# Patient Record
Sex: Female | Born: 1980 | Race: Black or African American | Hispanic: No | Marital: Single | Smoking: Never smoker
Health system: Southern US, Community
[De-identification: ages and names within clinical notes are randomized; demographics above are authoritative.]

## PROBLEM LIST (undated history)

## (undated) DIAGNOSIS — N760 Acute vaginitis: Secondary | ICD-10-CM

## (undated) DIAGNOSIS — I10 Essential (primary) hypertension: Secondary | ICD-10-CM

## (undated) DIAGNOSIS — Z309 Encounter for contraceptive management, unspecified: Secondary | ICD-10-CM

## (undated) DIAGNOSIS — B001 Herpesviral vesicular dermatitis: Secondary | ICD-10-CM

## (undated) DIAGNOSIS — N898 Other specified noninflammatory disorders of vagina: Principal | ICD-10-CM

## (undated) DIAGNOSIS — R87629 Unspecified abnormal cytological findings in specimens from vagina: Secondary | ICD-10-CM

## (undated) DIAGNOSIS — B9689 Other specified bacterial agents as the cause of diseases classified elsewhere: Secondary | ICD-10-CM

## (undated) HISTORY — DX: Other specified noninflammatory disorders of vagina: N89.8

## (undated) HISTORY — DX: Encounter for contraceptive management, unspecified: Z30.9

## (undated) HISTORY — DX: Acute vaginitis: N76.0

## (undated) HISTORY — DX: Herpesviral vesicular dermatitis: B00.1

## (undated) HISTORY — DX: Other specified bacterial agents as the cause of diseases classified elsewhere: B96.89

## (undated) HISTORY — DX: Essential (primary) hypertension: I10

## (undated) HISTORY — DX: Unspecified abnormal cytological findings in specimens from vagina: R87.629

---

## 2015-06-22 ENCOUNTER — Ambulatory Visit (INDEPENDENT_AMBULATORY_CARE_PROVIDER_SITE_OTHER): Payer: Managed Care, Other (non HMO) | Admitting: Adult Health

## 2015-06-22 ENCOUNTER — Encounter: Payer: Self-pay | Admitting: Adult Health

## 2015-06-22 VITALS — BP 128/78 | HR 80 | Ht 59.0 in | Wt 120.0 lb

## 2015-06-22 DIAGNOSIS — Z30013 Encounter for initial prescription of injectable contraceptive: Secondary | ICD-10-CM

## 2015-06-22 DIAGNOSIS — N9489 Other specified conditions associated with female genital organs and menstrual cycle: Secondary | ICD-10-CM

## 2015-06-22 DIAGNOSIS — N760 Acute vaginitis: Secondary | ICD-10-CM | POA: Diagnosis not present

## 2015-06-22 DIAGNOSIS — Z309 Encounter for contraceptive management, unspecified: Secondary | ICD-10-CM | POA: Insufficient documentation

## 2015-06-22 DIAGNOSIS — A499 Bacterial infection, unspecified: Secondary | ICD-10-CM

## 2015-06-22 DIAGNOSIS — B9689 Other specified bacterial agents as the cause of diseases classified elsewhere: Secondary | ICD-10-CM

## 2015-06-22 DIAGNOSIS — N898 Other specified noninflammatory disorders of vagina: Secondary | ICD-10-CM

## 2015-06-22 HISTORY — DX: Encounter for contraceptive management, unspecified: Z30.9

## 2015-06-22 HISTORY — DX: Other specified bacterial agents as the cause of diseases classified elsewhere: B96.89

## 2015-06-22 HISTORY — DX: Other specified noninflammatory disorders of vagina: N89.8

## 2015-06-22 LAB — POCT WET PREP (WET MOUNT)
Clue Cells Wet Prep Whiff POC: POSITIVE
WBC, Wet Prep HPF POC: POSITIVE

## 2015-06-22 MED ORDER — METRONIDAZOLE 500 MG PO TABS: 500.0000 mg | ORAL_TABLET | Freq: Two times a day (BID) | ORAL | Status: DC

## 2015-06-22 MED ORDER — MEDROXYPROGESTERONE ACETATE 150 MG/ML IM SUSP: 150.0000 mg | INTRAMUSCULAR | Status: DC

## 2015-06-22 NOTE — Progress Notes (Signed)
Subjective:     Patient ID: Heather PerchesLatisha Dodson, female   DOB: [birth year], 35 y.o.   MRN: [id number]  HPI [name] is a 35 year old black female, new to this practice in complaining of vaginal odor, smells fishy, no discharge and wants to get back on Depo.   Review of Systems Patient denies any headaches, hearing loss, fatigue, blurred vision, shortness of breath, chest pain, abdominal pain, problems with bowel movements, urination, or intercourse(not having often). No joint pain or mood swings.See HPI for positives. Reviewed past medical,surgical, social and family history. Reviewed medications and allergies.     Objective:   Physical Exam   BP 128/78 mmHg  Pulse 80  Ht 4\' 11"  (1.499 m)  Wt 120 lb (54.432 kg)  BMI 24.22 kg/m2  LMP 06/13/2015 Skin warm and dry. Neck: mid line trachea, normal thyroid, good ROM, no lymphadenopathy noted. Lungs: clear to ausculation bilaterally. Cardiovascular: regular rate and rhythm.Pelvic: external genitalia is normal in appearance no lesions, vagina: white discharge with odor,urethra has no lesions or masses noted, cervix:smooth and bulbous, uterus: normal size, shape and contour, non tender, no masses felt, adnexa: no masses or tenderness noted. Bladder is non tender and no masses felt. Wet prep: + for clue cells and +WBCs.  She wants to get on Depo, has used in past, had some hair loss then, but was years ago.She is aware it could happen again, she is also aware could have irregular bleeding or no period, or weight gain and still wants to try it. Will get her back in for pap and physical next week. Face time 20 minutes, with 50% counseling.    Assessment:    Vaginal odor BV Contraceptive management     Plan:    Rx flagyl 500 mg 1 bid x 7 days, no alcohol, review handout on BV   Return in 1 week for pap and physical and GC/CHL then  Rx Depo provera 150 mg, disp.# 1 vail for IM injection every 3 months in office, with 4 refills, call with period to get first  injection

## 2015-06-22 NOTE — Patient Instructions (Signed)
Bacterial Vaginosis Bacterial vaginosis is a vaginal infection that occurs when the normal balance of bacteria in the vagina is disrupted. It results from an overgrowth of certain bacteria. This is the most common vaginal infection in women of childbearing age. Treatment is important to prevent complications, especially in pregnant women, as it can cause a premature delivery. CAUSES  Bacterial vaginosis is caused by an increase in harmful bacteria that are normally present in smaller amounts in the vagina. Several different kinds of bacteria can cause bacterial vaginosis. However, the reason that the condition develops is not fully understood. RISK FACTORS Certain activities or behaviors can put you at an increased risk of developing bacterial vaginosis, including:  Having a new sex partner or multiple sex partners.  Douching.  Using an intrauterine device (IUD) for contraception. Women do not get bacterial vaginosis from toilet seats, bedding, swimming pools, or contact with objects around them. SIGNS AND SYMPTOMS  Some women with bacterial vaginosis have no signs or symptoms. Common symptoms include:  Grey vaginal discharge.  A fishlike odor with discharge, especially after sexual intercourse.  Itching or burning of the vagina and vulva.  Burning or pain with urination. DIAGNOSIS  Your health care provider will take a medical history and examine the vagina for signs of bacterial vaginosis. A sample of vaginal fluid may be taken. Your health care provider will look at this sample under a microscope to check for bacteria and abnormal cells. A vaginal pH test may also be done.  TREATMENT  Bacterial vaginosis may be treated with antibiotic medicines. These may be given in the form of a pill or a vaginal cream. A second round of antibiotics may be prescribed if the condition comes back after treatment. Because bacterial vaginosis increases your risk for sexually transmitted diseases, getting  treated can help reduce your risk for chlamydia, gonorrhea, HIV, and herpes. HOME CARE INSTRUCTIONS   Only take over-the-counter or prescription medicines as directed by your health care provider.  If antibiotic medicine was prescribed, take it as directed. Make sure you finish it even if you start to feel better.  Tell all sexual partners that you have a vaginal infection. They should see their health care provider and be treated if they have problems, such as a mild rash or itching.  During treatment, it is important that you follow these instructions:  Avoid sexual activity or use condoms correctly.  Do not douche.  Avoid alcohol as directed by your health care provider.  Avoid breastfeeding as directed by your health care provider. SEEK MEDICAL CARE IF:   Your symptoms are not improving after 3 days of treatment.  You have increased discharge or pain.  You have a fever. MAKE SURE YOU:   Understand these instructions.  Will watch your condition.  Will get help right away if you are not doing well or get worse. FOR MORE INFORMATION  Centers for Disease Control and Prevention, Division of STD Prevention: SolutionApps.co.zawww.cdc.gov/std American Sexual Health Association (ASHA): www.ashastd.org    This information is not intended to replace advice given to you by your health care provider. Make sure you discuss any questions you have with your health care provider.   Document Released: 04/02/2005 Document Revised: 04/23/2014 Document Reviewed: 11/12/2012 Elsevier Interactive Patient Education 2016 [employer] No alcohol or sex Return in 1 week for pap and physical

## 2015-07-04 ENCOUNTER — Encounter: Payer: Self-pay | Admitting: Adult Health

## 2015-07-04 ENCOUNTER — Other Ambulatory Visit (HOSPITAL_COMMUNITY)
Admission: RE | Admit: 2015-07-04 | Discharge: 2015-07-04 | Disposition: A | Discharge status: Home / Self Care | Payer: Managed Care, Other (non HMO) | Source: Ambulatory Visit | Attending: Adult Health | Admitting: Adult Health

## 2015-07-04 ENCOUNTER — Ambulatory Visit (INDEPENDENT_AMBULATORY_CARE_PROVIDER_SITE_OTHER): Payer: Managed Care, Other (non HMO) | Admitting: Adult Health

## 2015-07-04 VITALS — BP 130/80 | HR 76 | Ht 61.0 in | Wt 118.5 lb

## 2015-07-04 DIAGNOSIS — Z01419 Encounter for gynecological examination (general) (routine) without abnormal findings: Secondary | ICD-10-CM | POA: Diagnosis not present

## 2015-07-04 DIAGNOSIS — Z1151 Encounter for screening for human papillomavirus (HPV): Secondary | ICD-10-CM | POA: Insufficient documentation

## 2015-07-04 DIAGNOSIS — B001 Herpesviral vesicular dermatitis: Secondary | ICD-10-CM

## 2015-07-04 DIAGNOSIS — Z113 Encounter for screening for infections with a predominantly sexual mode of transmission: Secondary | ICD-10-CM | POA: Diagnosis present

## 2015-07-04 DIAGNOSIS — Z01411 Encounter for gynecological examination (general) (routine) with abnormal findings: Secondary | ICD-10-CM | POA: Diagnosis present

## 2015-07-04 HISTORY — DX: Herpesviral vesicular dermatitis: B00.1

## 2015-07-04 MED ORDER — VALACYCLOVIR HCL 1 G PO TABS: ORAL_TABLET | ORAL | Status: DC

## 2015-07-04 NOTE — Progress Notes (Signed)
Patient ID: Heather PerchesLatisha Dodson, female   DOB: 1981/03/01, 35 y.o.   MRN: [id number] History of Present Illness: Heather Dodson is a 35 year old black female in for a well woman gyn exam and pap,she was seen 3/8 and treated for BV and was given Rx for depo, to start with next period.She complains of sore bump bottom lip, started itching yesterday.She says no more discharge or odor.  Current Medications, Allergies, Past Medical History, Past Surgical History, Family History and Social History were reviewed in Heather Dodson.     Review of Systems: Patient denies any headaches, hearing loss, fatigue, blurred vision, shortness of breath, chest pain, abdominal pain, problems with bowel movements, urination, or intercourse. No joint pain or mood swings.See HPI for positives.    Physical Exam:BP 130/80 mmHg  Pulse 76  Ht 5\' 1"  (1.549 m)  Wt 118 lb 8 oz (53.751 kg)  BMI 22.40 kg/m2  LMP 06/14/2015 General:  Well developed, well nourished, no acute distress Skin:  Warm and dry, has cold sore bottom lip Neck:  Midline trachea, normal thyroid, good ROM, no lymphadenopathy Lungs; Clear to auscultation bilaterally Breast:  No dominant palpable mass, retraction, or nipple discharge Cardiovascular: Regular rate and rhythm Abdomen:  Soft, non tender, no hepatosplenomegaly Pelvic:  External genitalia is normal in appearance, no lesions.  The vagina is normal in appearance. Urethra has no lesions or masses. The cervix is smooth. Pap with GC/CHL and HPV performed. Uterus is felt to be normal size, shape, and contour.  No adnexal masses or tenderness noted.Bladder is non tender, no masses felt. Extremities/musculoskeletal:  No swelling or varicosities noted, no clubbing or cyanosis Psych:  No mood changes, alert and cooperative,seems happy Discussed causes of cold sore.  Impression: Well woman gyn exam and pap Cold sore     Plan: Rx valtrex 1 gm #10 take 2 po now and 2 in am with 1  refill Discard lip gloss and tooth brush Physical in 1 year, pap in 3 if normal Call with period for depo  Review handout on cold sores

## 2015-07-04 NOTE — Patient Instructions (Signed)
Oral Ulcers Oral ulcers are painful, shallow sores around the lining of the mouth. They can affect the gums, the inside of the lips, and the cheeks. (Sores on the outside of the lips and on the face are different.) They typically first occur in school-aged children and teenagers. Oral ulcers may also be called canker sores or cold sores. CAUSES  Canker sores and cold sores can be caused by many factors including:  Infection.  Injury.  Sun exposure.  Medications.  Emotional stress.  Food allergies.  Vitamin deficiencies.  Toothpastes containing sodium lauryl sulfate. The herpes virus can be the cause of mouth ulcers. The first infection can be severe and cause 10 or more ulcers on the gums, tongue, and lips with fever and difficulty in swallowing. This infection usually occurs between the ages of 75 and 3 years.  SYMPTOMS  The typical sore is about  inch (6 mm) in size and is an oval or round ulcer with red borders. DIAGNOSIS  Your caregiver can diagnose simple oral ulcers by examination. Additional testing is usually not required.  TREATMENT  Treatment is aimed at pain relief. Generally, oral ulcers resolve by themselves within 1 to 2 weeks without medication and are not contagious unless caused by herpes (and other viruses). Antibiotics are not effective with mouth sores. Avoid direct contact with others until the ulcer is completely healed. See your caregiver for follow-up care as recommended. Also:  Offer a soft diet.  Encourage plenty of fluids to prevent dehydration. Popsicles and milk shakes can be helpful.  Avoid acidic and salty foods and drinks such as orange juice.  Infants and young children will often refuse to drink because of pain. Using a teaspoon, cup, or syringe to give small amounts of fluids frequently can help prevent dehydration.  Cold compresses on the face may help reduce pain.  Pain medication can help control soreness.  A solution of diphenhydramine  mixed with a liquid antacid can be useful to decrease the soreness of ulcers. Consult a caregiver for the dosing.  Liquids or ointments with a numbing ingredient may be helpful when used as recommended.  Older children and teenagers can rinse their mouth with a salt-water mixture (1/2 teaspoon of salt in 8 ounces of water) four times a day. This treatment is uncomfortable but may reduce the time the ulcers are present.  There are many over-the-counter throat lozenges and medications available for oral ulcers. Their effectiveness has not been studied.  Consult your medical caregiver prior to using homeopathic treatments for oral ulcers. SEEK MEDICAL CARE IF:   You think your child needs to be seen.  The pain worsens and you cannot control it.  There are 4 or more ulcers.  The lips and gums begin to bleed and crust.  A single mouth ulcer is near a tooth that is causing a toothache or pain.  Your child has a fever, swollen face, or swollen glands.  The ulcers began after starting a medication.  Mouth ulcers keep reoccurring or last more than 2 weeks.  You think your child is not taking adequate fluids. SEEK IMMEDIATE MEDICAL CARE IF:   Your child has a high fever.  Your child is unable to swallow or becomes dehydrated.  Your child looks or acts very ill.  An ulcer caused by a chemical your child accidentally put in their mouth.   This information is not intended to replace advice given to you by your health care provider. Make sure you discuss any  questions you have with your health care provider.   Document Released: 05/10/2004 Document Revised: 04/23/2014 Document Reviewed: 08/18/2014 Elsevier Interactive Patient Education 2016 [employer] Take 2 valtrex now and 2 in am Physical in 1 year Call with period to get depo

## 2015-07-05 LAB — CYTOLOGY - PAP

## 2015-07-19 ENCOUNTER — Ambulatory Visit (INDEPENDENT_AMBULATORY_CARE_PROVIDER_SITE_OTHER): Payer: Managed Care, Other (non HMO) | Admitting: *Deleted

## 2015-07-19 ENCOUNTER — Encounter: Payer: Self-pay | Admitting: *Deleted

## 2015-07-19 DIAGNOSIS — Z3202 Encounter for pregnancy test, result negative: Secondary | ICD-10-CM | POA: Diagnosis not present

## 2015-07-19 DIAGNOSIS — Z3042 Encounter for surveillance of injectable contraceptive: Secondary | ICD-10-CM | POA: Diagnosis not present

## 2015-07-19 LAB — POCT URINE PREGNANCY: Preg Test, Ur: NEGATIVE

## 2015-07-19 MED ORDER — MEDROXYPROGESTERONE ACETATE 150 MG/ML IM SUSP
150.0000 mg | Freq: Once | INTRAMUSCULAR | Status: AC
  Administered 2015-07-19: 150 mg via INTRAMUSCULAR

## 2015-07-19 NOTE — Progress Notes (Signed)
Pt here for Depo. Pt tolerated shot well. Return in 12 weeks for next shot. JSY 

## 2015-09-28 ENCOUNTER — Telehealth: Payer: Self-pay | Admitting: *Deleted

## 2015-09-28 MED ORDER — MEGESTROL ACETATE 40 MG PO TABS: ORAL_TABLET | ORAL | Status: DC

## 2015-09-28 NOTE — Telephone Encounter (Signed)
Pt complains spotting with depo, next shot due 6/23 ,will rx megace to see if helps, told her usually when gets the next shot it should stop

## 2015-10-10 ENCOUNTER — Ambulatory Visit (INDEPENDENT_AMBULATORY_CARE_PROVIDER_SITE_OTHER): Payer: Managed Care, Other (non HMO) | Admitting: *Deleted

## 2015-10-10 ENCOUNTER — Encounter: Payer: Self-pay | Admitting: *Deleted

## 2015-10-10 DIAGNOSIS — Z3042 Encounter for surveillance of injectable contraceptive: Secondary | ICD-10-CM | POA: Diagnosis not present

## 2015-10-10 DIAGNOSIS — Z3202 Encounter for pregnancy test, result negative: Secondary | ICD-10-CM | POA: Diagnosis not present

## 2015-10-10 LAB — POCT URINE PREGNANCY: Preg Test, Ur: NEGATIVE

## 2015-10-10 MED ORDER — MEDROXYPROGESTERONE ACETATE 150 MG/ML IM SUSP
150.0000 mg | Freq: Once | INTRAMUSCULAR | Status: AC
Start: 2015-10-10 — End: 2015-10-10
  Administered 2015-10-10: 150 mg via INTRAMUSCULAR

## 2015-10-10 NOTE — Progress Notes (Signed)
Pt here for Depo Provera injection, reports no problems or concerns at this time.  Injection given in Lt Deltoid and pt tolerated well.

## 2015-10-11 ENCOUNTER — Ambulatory Visit: Payer: Managed Care, Other (non HMO)

## 2016-01-02 ENCOUNTER — Ambulatory Visit (INDEPENDENT_AMBULATORY_CARE_PROVIDER_SITE_OTHER): Payer: Managed Care, Other (non HMO) | Admitting: *Deleted

## 2016-01-02 DIAGNOSIS — Z3202 Encounter for pregnancy test, result negative: Secondary | ICD-10-CM

## 2016-01-02 DIAGNOSIS — Z3042 Encounter for surveillance of injectable contraceptive: Secondary | ICD-10-CM

## 2016-01-02 LAB — POCT URINE PREGNANCY: Preg Test, Ur: NEGATIVE

## 2016-01-02 MED ORDER — MEDROXYPROGESTERONE ACETATE 150 MG/ML IM SUSP
150.0000 mg | Freq: Once | INTRAMUSCULAR | Status: AC
  Administered 2016-01-02: 150 mg via INTRAMUSCULAR

## 2016-01-02 NOTE — Progress Notes (Signed)
Depo Provera 150 mg IM given in right deltoid with no complications, negative pregnancy test. Pt to return in 12 weeks for next injection.  

## 2016-03-26 ENCOUNTER — Encounter: Payer: Self-pay | Admitting: *Deleted

## 2016-03-26 ENCOUNTER — Ambulatory Visit (INDEPENDENT_AMBULATORY_CARE_PROVIDER_SITE_OTHER): Payer: Managed Care, Other (non HMO) | Admitting: *Deleted

## 2016-03-26 ENCOUNTER — Encounter (INDEPENDENT_AMBULATORY_CARE_PROVIDER_SITE_OTHER): Payer: Self-pay

## 2016-03-26 DIAGNOSIS — Z3042 Encounter for surveillance of injectable contraceptive: Secondary | ICD-10-CM | POA: Diagnosis not present

## 2016-03-26 DIAGNOSIS — Z308 Encounter for other contraceptive management: Secondary | ICD-10-CM

## 2016-03-26 DIAGNOSIS — Z3202 Encounter for pregnancy test, result negative: Secondary | ICD-10-CM

## 2016-03-26 LAB — POCT URINE PREGNANCY: PREG TEST UR: NEGATIVE

## 2016-03-26 MED ORDER — MEDROXYPROGESTERONE ACETATE 150 MG/ML IM SUSP
150.0000 mg | Freq: Once | INTRAMUSCULAR | Status: AC
  Administered 2016-03-26: 150 mg via INTRAMUSCULAR

## 2016-03-26 NOTE — Progress Notes (Signed)
Pt here for Depo. Pt tolerated shot well. Return in 12 weeks for next shot. JSY 

## 2016-05-24 ENCOUNTER — Ambulatory Visit: Payer: Self-pay | Admitting: Physician Assistant

## 2016-05-24 ENCOUNTER — Encounter: Payer: Self-pay | Admitting: Physician Assistant

## 2016-05-24 VITALS — BP 150/88 | HR 108 | Temp 99.2°F

## 2016-05-24 DIAGNOSIS — B349 Viral infection, unspecified: Secondary | ICD-10-CM

## 2016-05-24 LAB — POCT INFLUENZA A/B

## 2016-05-24 MED ORDER — OSELTAMIVIR PHOSPHATE 75 MG PO CAPS: 75.0000 mg | ORAL_CAPSULE | Freq: Two times a day (BID) | ORAL | 0 refills | Status: DC

## 2016-05-24 MED ORDER — PSEUDOEPH-BROMPHEN-DM 30-2-10 MG/5ML PO SYRP: 5.0000 mL | ORAL_SOLUTION | Freq: Four times a day (QID) | ORAL | 0 refills | Status: DC | PRN

## 2016-05-24 MED ORDER — IBUPROFEN 800 MG PO TABS: 800.0000 mg | ORAL_TABLET | Freq: Three times a day (TID) | ORAL | 0 refills | Status: DC | PRN

## 2016-05-24 NOTE — Progress Notes (Signed)
   Subjective:URI    Patient ID: Heather PerchesLatisha Dodson, female    DOB: Mar 08, 1981, 36 y.o.   MRN: [id number]  HPI Patient c/o fever, sore throat, nasal congestion, and cough for 2 days. Recently exposed to Flu. Patient has not taken Flu shot for this season. Denies N/V/D. No palliative measures for compliant.   Review of Systems    Negative except for compliant. Objective:   Physical Exam Febrile. Edematous nasal turbinates with clear rhinorrhea. Post nasal drainage. Neck supple w/o adenopathy. Lungs CTA and Heart RRR.Rapid Flu test was negative.       Assessment & Plan:Flu Like Illness.  Tamiflu, Bromfed DM, and Ibuprofen. Follow up with Family Doctor.

## 2016-05-24 NOTE — Addendum Note (Signed)
Addended by: Catha BrowEACON, Sharran Caratachea T on: 05/24/2016 04:14 PM   Modules accepted: Orders

## 2016-06-18 ENCOUNTER — Ambulatory Visit: Payer: Managed Care, Other (non HMO)

## 2016-06-22 ENCOUNTER — Ambulatory Visit: Payer: Managed Care, Other (non HMO)

## 2016-06-22 ENCOUNTER — Other Ambulatory Visit: Payer: Self-pay | Admitting: Adult Health

## 2016-06-25 ENCOUNTER — Encounter: Payer: Self-pay | Admitting: *Deleted

## 2016-06-25 ENCOUNTER — Ambulatory Visit: Payer: Managed Care, Other (non HMO)

## 2016-10-11 ENCOUNTER — Other Ambulatory Visit: Payer: Managed Care, Other (non HMO) | Admitting: Women's Health

## 2016-10-11 ENCOUNTER — Encounter: Payer: Self-pay | Admitting: Advanced Practice Midwife

## 2016-10-11 ENCOUNTER — Ambulatory Visit (INDEPENDENT_AMBULATORY_CARE_PROVIDER_SITE_OTHER): Payer: Managed Care, Other (non HMO) | Admitting: Advanced Practice Midwife

## 2016-10-11 VITALS — BP 110/80 | HR 76 | Ht 59.0 in | Wt 128.0 lb

## 2016-10-11 DIAGNOSIS — Z13220 Encounter for screening for lipoid disorders: Secondary | ICD-10-CM | POA: Diagnosis not present

## 2016-10-11 DIAGNOSIS — Z01419 Encounter for gynecological examination (general) (routine) without abnormal findings: Secondary | ICD-10-CM

## 2016-10-11 DIAGNOSIS — Z131 Encounter for screening for diabetes mellitus: Secondary | ICD-10-CM

## 2016-10-11 NOTE — Progress Notes (Signed)
Heather PerchesLatisha Dodson 36 y.o.  Vitals:   10/11/16 1626  BP: 110/80  Pulse: 76     Filed Weights   10/11/16 1626  Weight: 128 lb (58.1 kg)    Past Medical History: Past Medical History:  Diagnosis Date  . BV (bacterial vaginosis) 06/22/2015  . 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 Contraceptive management 06/22/2015  . Vaginal odor 06/22/2015  . Vaginal Pap smear, abnormal     Past Surgical History: History reviewed. No pertinent surgical history.  Family History: Family History  Problem Relation Age of Onset  . Hypertension Mother   . Other Mother        had kidney removed due to kidney failure  . Cancer Mother        breast  . Hypertension Father   . Hyperlipidemia Father   . Diabetes Father   . Cancer Father        prostate  . Hypertension Sister   . Cancer Paternal Grandmother   . Cancer Paternal Grandfather     Social History: Social History  Substance Use Topics  . Smoking status: Never Smoker  . Smokeless tobacco: Never Used  . Alcohol use Yes     Comment: occ    Allergies: No Known Allergies   No current outpatient prescriptions on file.  History of Present Illness: Here for physical, needs form filled out for work.  Has a place for LDL/HDL/and blood sugar, but had those done at work w/ a fingerstick, so labs ordered but she is going to check with work and see if she really needs to have them rechecked.  Had last depo shot in January, but still hasn;t started having periods yet.  Aware that it can take 12-18  Months in some cases for depo to wear off.  Not interested in a pg challenge.  Doesn't want any birth control now, boyfriend lives in another state.  Options discussed. Last pap 09/2015, normal.     Review of Systems   Patient denies any headaches, blurred vision, shortness of breath, chest pain, abdominal pain, problems with bowel movements, urination, or intercourse.   Physical Exam: General:  Well developed, well nourished, no acute distress Skin:  Warm and  dry Neck:  Midline trachea, normal thyroid Lungs; Clear to auscultation bilaterally Breast:  No dominant palpable mass, retraction, or nipple discharge Cardiovascular: Regular rate and rhythm Abdomen:  Soft, non tender, no hepatosplenomegaly Pelvic:  External genitalia is normal in appearance.  The vagina is normal in appearance.  The cervix is bulbous.  Uterus is felt to be normal size, shape, and contour.  No adnexal masses or tenderness noted.  Extremities:  No swelling or varicosities noted Psych:  No mood chang                    Impression: normal GYN exam     Plan: pap in 2020 [facility]

## 2016-12-20 ENCOUNTER — Ambulatory Visit: Payer: Self-pay | Admitting: Physician Assistant

## 2016-12-20 VITALS — BP 120/60 | HR 86 | Temp 98.6°F | Resp 16

## 2016-12-20 DIAGNOSIS — R3915 Urgency of urination: Secondary | ICD-10-CM

## 2016-12-20 DIAGNOSIS — N390 Urinary tract infection, site not specified: Secondary | ICD-10-CM

## 2016-12-20 DIAGNOSIS — R319 Hematuria, unspecified: Secondary | ICD-10-CM

## 2016-12-20 LAB — POCT URINALYSIS DIPSTICK
Bilirubin, UA: NEGATIVE
Glucose, UA: NEGATIVE
KETONES UA: NEGATIVE
NITRITE UA: POSITIVE
PH UA: 5.5 (ref 5.0–8.0)
PROTEIN UA: NEGATIVE
Spec Grav, UA: 1.01 (ref 1.010–1.025)
UROBILINOGEN UA: 0.2 U/dL

## 2016-12-20 MED ORDER — CIPROFLOXACIN HCL 250 MG PO TABS: 250.0000 mg | ORAL_TABLET | Freq: Two times a day (BID) | ORAL | 0 refills | Status: DC

## 2016-12-20 NOTE — Progress Notes (Signed)
S:  C/o uti sx for 2 days,  urgency, frequency, denies vaginal discharge, abdominal pain or flank pain, no painful urination, no fever/chills:  Remainder ros neg  O:  Vitals wnl, nad, no cva tenderness, back nontender, lungs c t a,cv rrr,  n/v intact  A: uti  P: cipro  bid x 7d, increase water intake, add cranberry juice, return if not improving in 2 -3 days, return earlier if worsening, discussed pyelonephritis sx, urine culture

## 2016-12-22 LAB — URINE CULTURE

## 2017-05-09 ENCOUNTER — Ambulatory Visit: Payer: Self-pay | Admitting: Medical

## 2017-05-09 ENCOUNTER — Ambulatory Visit: Payer: Self-pay

## 2017-05-09 VITALS — BP 150/89 | HR 86 | Temp 98.4°F | Resp 16

## 2017-05-09 DIAGNOSIS — J069 Acute upper respiratory infection, unspecified: Secondary | ICD-10-CM

## 2017-05-09 DIAGNOSIS — J0100 Acute maxillary sinusitis, unspecified: Secondary | ICD-10-CM

## 2017-05-09 MED ORDER — AMOXICILLIN-POT CLAVULANATE 875-125 MG PO TABS: 1.0000 | ORAL_TABLET | Freq: Two times a day (BID) | ORAL | 0 refills | Status: DC

## 2017-05-09 NOTE — Progress Notes (Signed)
   Subjective:    Patient ID: Gerhard PerchesLatisha Pucci, female    DOB: [birth year], 37 y.o.   MRN: [id number]  HPI  4 days history of sinus congestion and cough yellow productive. Denies fevers but has had chills. Initially started as sore throat but that has been improving.  Took Nyquil last night and slept well.   Review of Systems  Constitutional: Positive for chills and fatigue. Negative for fever.  HENT: Positive for congestion, postnasal drip, rhinorrhea and sore throat. Negative for ear pain.   Respiratory: Positive for cough. Negative for shortness of breath.   Cardiovascular: Negative for chest pain.  Gastrointestinal: Negative for abdominal pain.  Musculoskeletal: Positive for myalgias.  Skin: Negative for rash.  Neurological: Negative for dizziness, syncope and light-headedness.   Last period 05/01/2017 Denies pregnancy    Objective:   Physical Exam  Constitutional: She is oriented to person, place, and time. She appears well-developed and well-nourished.  HENT:  Head: Normocephalic and atraumatic.  Right Ear: Hearing, external ear and ear canal normal.  Left Ear: Hearing, external ear and ear canal normal.  Nose: Mucosal edema and rhinorrhea present.  Mouth/Throat: Uvula is midline. Posterior oropharyngeal erythema present.  Eyes: Conjunctivae and EOM are normal.  Neck: Normal range of motion. Neck supple.  Cardiovascular: Normal rate, regular rhythm and normal heart sounds.  Pulmonary/Chest: Effort normal and breath sounds normal.  Lymphadenopathy:    She has no cervical adenopathy.  Neurological: She is alert and oriented to person, place, and time.  Skin: Skin is warm and dry.  Psychiatric: She has a normal mood and affect. Her behavior is normal. Judgment and thought content normal.      Cerumen obscuring TM bilaterally    Assessment & Plan:  Sinusitis / Upper Respiratory Infection. Meds ordered this encounter  Medications  . amoxicillin-clavulanate (AUGMENTIN)  875-125 MG tablet    Sig: Take 1 tablet by mouth 2 (two) times daily.    Dispense:  20 tablet    Refill:  0  Return in  3-5 days if not improving. Patient verbalizes understanding and has no questions at discharge.

## 2017-05-09 NOTE — Patient Instructions (Signed)
Upper Respiratory Infection, Adult Most upper respiratory infections (URIs) are caused by a virus. A URI affects the nose, throat, and upper air passages. The most common type of URI is often called "the common cold." Follow these instructions at home:  Take medicines only as told by your doctor.  Gargle warm saltwater or take cough drops to comfort your throat as told by your doctor.  Use a warm mist humidifier or inhale steam from a shower to increase air moisture. This may make it easier to breathe.  Drink enough fluid to keep your pee (urine) clear or pale yellow.  Eat soups and other clear broths.  Have a healthy diet.  Rest as needed.  Go back to work when your fever is gone or your doctor says it is okay. ? You may need to stay home longer to avoid giving your URI to others. ? You can also wear a face mask and wash your hands often to prevent spread of the virus.  Use your inhaler more if you have asthma.  Do not use any tobacco products, including cigarettes, chewing tobacco, or electronic cigarettes. If you need help quitting, ask your doctor. Contact a doctor if:  You are getting worse, not better.  Your symptoms are not helped by medicine.  You have chills.  You are getting more short of breath.  You have brown or red mucus.  You have yellow or brown discharge from your nose.  You have pain in your face, especially when you bend forward.  You have a fever.  You have puffy (swollen) neck glands.  You have pain while swallowing.  You have white areas in the back of your throat. Get help right away if:  You have very bad or constant: ? Headache. ? Ear pain. ? Pain in your forehead, behind your eyes, and over your cheekbones (sinus pain). ? Chest pain.  You have long-lasting (chronic) lung disease and any of the following: ? Wheezing. ? Long-lasting cough. ? Coughing up blood. ? A change in your usual mucus.  You have a stiff neck.  You have  changes in your: ? Vision. ? Hearing. ? Thinking. ? Mood. This information is not intended to replace advice given to you by your health care provider. Make sure you discuss any questions you have with your health care provider. Document Released: 09/19/2007 Document Revised: 12/04/2015 Document Reviewed: 07/08/2013 Elsevier Interactive Patient Education  2018 [employer] Inc. Sinusitis, Adult Sinusitis is soreness and inflammation of your sinuses. Sinuses are hollow spaces in the bones around your face. They are located:  Around your eyes.  In the middle of your forehead.  Behind your nose.  In your cheekbones.  Your sinuses and nasal passages are lined with a stringy fluid (mucus). Mucus normally drains out of your sinuses. When your nasal tissues get inflamed or swollen, the mucus can get trapped or blocked so air cannot flow through your sinuses. This lets bacteria, viruses, and funguses grow, and that leads to infection. Follow these instructions at home: Medicines  Take, use, or apply over-the-counter and prescription medicines only as told by your doctor. These may include nasal sprays.  If you were prescribed an antibiotic medicine, take it as told by your doctor. Do not stop taking the antibiotic even if you start to feel better. Hydrate and Humidify  Drink enough water to keep your pee (urine) clear or pale yellow.  Use a cool mist humidifier to keep the humidity level in your home above   50%.  Breathe in steam for 10-15 minutes, 3-4 times a day or as told by your doctor. You can do this in the bathroom while a hot shower is running.  Try not to spend time in cool or dry air. Rest  Rest as much as possible.  Sleep with your head raised (elevated).  Make sure to get enough sleep each night. General instructions  Put a warm, moist washcloth on your face 3-4 times a day or as told by your doctor. This will help with discomfort.  Wash your hands often with soap and  water. If there is no soap and water, use hand sanitizer.  Do not smoke. Avoid being around people who are smoking (secondhand smoke).  Keep all follow-up visits as told by your doctor. This is important. Contact a doctor if:  You have a fever.  Your symptoms get worse.  Your symptoms do not get better within 10 days. Get help right away if:  You have a very bad headache.  You cannot stop throwing up (vomiting).  You have pain or swelling around your face or eyes.  You have trouble seeing.  You feel confused.  Your neck is stiff.  You have trouble breathing. This information is not intended to replace advice given to you by your health care provider. Make sure you discuss any questions you have with your health care provider. Document Released: 09/19/2007 Document Revised: 11/27/2015 Document Reviewed: 01/26/2015 Elsevier Interactive Patient Education  2018 [employer]vier Inc.  

## 2017-08-15 ENCOUNTER — Ambulatory Visit: Payer: Self-pay | Admitting: Family Medicine

## 2017-08-15 VITALS — BP 147/85 | HR 86 | Temp 98.5°F | Resp 20

## 2017-08-15 DIAGNOSIS — R059 Cough, unspecified: Secondary | ICD-10-CM

## 2017-08-15 DIAGNOSIS — J0100 Acute maxillary sinusitis, unspecified: Secondary | ICD-10-CM

## 2017-08-15 DIAGNOSIS — R05 Cough: Secondary | ICD-10-CM

## 2017-08-15 MED ORDER — BENZONATATE 200 MG PO CAPS: 200.0000 mg | ORAL_CAPSULE | Freq: Every evening | ORAL | 0 refills | Status: DC | PRN

## 2017-08-15 MED ORDER — AMOXICILLIN-POT CLAVULANATE 875-125 MG PO TABS: 1.0000 | ORAL_TABLET | Freq: Two times a day (BID) | ORAL | 0 refills | Status: AC

## 2017-08-15 NOTE — Progress Notes (Signed)
Subjective: Congestion     Heather Dodson is a 37 y.o. female who presents for evaluation of nonproductive cough, sore throat, purulent nasal congestion, body aches, and fever intermittently for the last 2 to 3 weeks.  Patient reports that initially she had a fever of 102 on the first day of illness 2 to 3 weeks ago but that this subsided after 2 days.  Patient reports she felt she was getting better until this Monday, when her fever recurred.  Patient reports low-grade fever this Monday and Tuesday that has resolved. T-max with recurrence of the fever on Monday and Tuesday was 100.0.  Denies recurrence of fever since then.  Patient reports she has had intermittent body aches since Monday.  Patient reports her nasal congestion has not improved at all.  Patient reports some pressure behind her eyes bilaterally.  Patient reports her cough is keeping her up at night. Treatment to date: NyQuil.  Denies rash, nausea, vomiting, diarrhea, shortness of breath, wheezing, chest or back pain, ear pain, difficulty swallowing, confusion, headache, fatigue, or severe symptoms. History of smoking, asthma, COPD: Negative. History of recurrent sinus and/or lung infections: Negative. Medical history: Denies any. Antibiotic use in the last 3 months: Negative.   Review of Systems Pertinent items noted in HPI and remainder of comprehensive ROS otherwise negative.     Objective:   Physical Exam General: Awake, alert, and oriented. No acute distress. Well developed, hydrated and nourished. Appears stated age. Nontoxic appearance.  HEENT:  PND noted.  Mild erythema to posterior oropharynx.  No edema or exudates of pharynx or tonsils. No erythema or bulging of TM.  Mild erythema/edema to nasal mucosa.  Right maxillary sinus tenderness. Sinuses otherwise nontender. Supple neck without adenopathy. Cardiac: Heart rate and rhythm are normal. No murmurs, gallops, or rubs are auscultated. S1 and S2 are heard and are of normal  intensity.  Respiratory: No signs of respiratory distress. Lungs clear. No tachypnea. Able to speak in full sentences without dyspnea. Nonlabored respirations.  Skin: Skin is warm, dry and intact. Appropriate color for ethnicity. No cyanosis noted.    Assessment:    sinusitis   Plan:    Discussed the diagnosis and treatment of sinusitis. Suggested symptomatic OTC remedies. Nasal saline spray for congestion.   Prescribed Augmentin.  Patient has taken this before and tolerated this well.   Prescribed Tessalon Perles to take at night as needed for cough. Discussed side/adverse effects of medications prescribed. Discussed red flag symptoms and circumstances with which to seek medical care.   New Prescriptions   AMOXICILLIN-CLAVULANATE (AUGMENTIN) 875-125 MG TABLET    Take 1 tablet by mouth 2 (two) times daily for 10 days.   BENZONATATE (TESSALON) 200 MG CAPSULE    Take 1 capsule (200 mg total) by mouth at bedtime as needed for cough.

## 2017-09-24 ENCOUNTER — Ambulatory Visit: Payer: Self-pay | Admitting: Family Medicine

## 2017-09-24 VITALS — BP 146/84 | HR 74 | Resp 16 | Ht 59.0 in | Wt 128.0 lb

## 2017-09-24 DIAGNOSIS — Z008 Encounter for other general examination: Secondary | ICD-10-CM

## 2017-09-24 DIAGNOSIS — Z0189 Encounter for other specified special examinations: Principal | ICD-10-CM

## 2017-09-24 NOTE — Progress Notes (Signed)
Subjective: Annual biometrics screening  Patient presents for her annual biometric screening. Patient reports eating a healthy, well-rounded diet and getting regular physical activity.  Patient regularly sees her primary care provider. Patient works for [employer]. Patient denies any other issues or concerns.   Review of Systems Unremarkable  Objective  Physical Exam General: Awake, alert and oriented. No acute distress. Well developed, hydrated and nourished. Appears stated age.  HEENT: Supple neck without adenopathy. Sclera is non-icteric. The ear canal is clear without discharge. The tympanic membrane is normal in appearance with normal landmarks and cone of light. Nasal mucosa is pink and moist. Oral mucosa is pink and moist. The pharynx is normal in appearance without tonsillar swelling or exudates.  Skin: Skin in warm, dry and intact without rashes or lesions. Appropriate color for ethnicity. Cardiac: Heart rate and rhythm are normal. No murmurs, gallops, or rubs are auscultated.  Respiratory: The chest wall is symmetric and without deformity. No signs of respiratory distress. Lung sounds are clear in all lobes bilaterally without rales, ronchi, or wheezes.  Neurological: The patient is awake, alert and oriented to person, place, and time with normal speech.  Memory is normal and thought processes intact. No gait abnormalities are appreciated.  Psychiatric: Appropriate mood and affect.   Assessment Annual biometrics screening  Plan  Lipid panel and blood sugar pending.  Patient reports having a few sips of sweet tea prior to arrival.  Otherwise fasting. Encouraged routine visits with primary care provider.  Patient's blood pressure is 146/84 today.  Discussed normal values.  Advised patient to monitor this at least 3 times a week for the next 2 to 3 weeks and report abnormal values to her primary care provider. Encouraged patient to get regular exercise and eat a healthy, well-rounded  diet.

## 2017-09-25 LAB — LIPID PANEL
Chol/HDL Ratio: 2.4 ratio (ref 0.0–4.4)
Cholesterol, Total: 155 mg/dL (ref 100–199)
HDL: 65 mg/dL (ref >39)
LDL Calculated: 80 mg/dL (ref 0–99)
Triglycerides: 49 mg/dL (ref 0–149)
VLDL CHOLESTEROL CAL: 10 mg/dL (ref 5–40)

## 2017-09-25 LAB — GLUCOSE, RANDOM: GLUCOSE: 86 mg/dL (ref 65–99)

## 2017-10-08 ENCOUNTER — Ambulatory Visit (INDEPENDENT_AMBULATORY_CARE_PROVIDER_SITE_OTHER): Payer: Managed Care, Other (non HMO) | Admitting: Adult Health

## 2017-10-08 ENCOUNTER — Encounter: Payer: Self-pay | Admitting: Adult Health

## 2017-10-08 VITALS — BP 137/85 | HR 87 | Ht 61.5 in | Wt 130.0 lb

## 2017-10-08 DIAGNOSIS — B9689 Other specified bacterial agents as the cause of diseases classified elsewhere: Secondary | ICD-10-CM | POA: Diagnosis not present

## 2017-10-08 DIAGNOSIS — Z3042 Encounter for surveillance of injectable contraceptive: Secondary | ICD-10-CM | POA: Insufficient documentation

## 2017-10-08 DIAGNOSIS — N898 Other specified noninflammatory disorders of vagina: Secondary | ICD-10-CM

## 2017-10-08 DIAGNOSIS — Z01419 Encounter for gynecological examination (general) (routine) without abnormal findings: Secondary | ICD-10-CM

## 2017-10-08 DIAGNOSIS — N760 Acute vaginitis: Secondary | ICD-10-CM | POA: Diagnosis not present

## 2017-10-08 DIAGNOSIS — Z01411 Encounter for gynecological examination (general) (routine) with abnormal findings: Secondary | ICD-10-CM | POA: Diagnosis not present

## 2017-10-08 LAB — POCT URINALYSIS DIPSTICK
Blood, UA: NEGATIVE
GLUCOSE UA: NEGATIVE
Nitrite, UA: NEGATIVE
PROTEIN UA: NEGATIVE

## 2017-10-08 LAB — POCT WET PREP (WET MOUNT)
CLUE CELLS WET PREP WHIFF POC: POSITIVE
WBC, Wet Prep HPF POC: POSITIVE

## 2017-10-08 MED ORDER — METRONIDAZOLE 500 MG PO TABS: 500.0000 mg | ORAL_TABLET | Freq: Two times a day (BID) | ORAL | 1 refills | Status: DC

## 2017-10-08 MED ORDER — MEDROXYPROGESTERONE ACETATE 150 MG/ML IM SUSP: INTRAMUSCULAR | 4 refills | Status: DC

## 2017-10-08 NOTE — Progress Notes (Signed)
Patient ID: Heather PerchesLatisha Dodson, female   DOB: 12-06-1980, 37 y.o.   MRN: [id number] History of Present Illness: Heather OdeaLatisha is a 37 year old black female in for well woman gyn exam, she had normal pap with negative HPV 07/04/15. No current PCP.    Current Medications, Allergies, Past Medical History, Past Surgical History, Family History and Social History were reviewed in [name] electronic medical record.     Review of Systems:  Patient denies any headaches, hearing loss, fatigue, blurred vision, shortness of breath, chest pain, abdominal pain, problems with bowel movements, urination, or intercourse. No joint pain or mood swings. +vaginal odor esp after sex,tingles with urination   Physical Exam:BP 137/85 (BP Location: Left Arm, Patient Position: Sitting, Cuff Size: Normal)   Pulse 87   Ht 5' 1.5" (1.562 m)   Wt 130 lb (59 kg)   BMI 24.17 kg/m urine  Dipstick trace leuks. General:  Well developed, well nourished, no acute distress Skin:  Warm and dry Neck:  Midline trachea, normal thyroid, good ROM, no lymphadenopathy Lungs; Clear to auscultation bilaterally Breast:  No dominant palpable mass, retraction, or nipple discharge Cardiovascular: Regular rate and rhythm Abdomen:  Soft, non tender, no hepatosplenomegaly Pelvic:  External genitalia is normal in appearance, no lesions.  The vagina is normal in appearance,has white creamy discharge with slight odor,. Urethra has no lesions or masses. The cervix is bulbous.  Uterus is felt to be normal size, shape, and contour.  No adnexal masses or tenderness noted.Bladder is non tender, no masses felt.wet prep: +WBCs and +clue cells Extremities/musculoskeletal:  No swelling or varicosities noted, no clubbing or cyanosis Psych:  No mood changes, alert and cooperative,seems happy PHQ 2 score 0.  Impression: 1. Encounter for well woman exam with routine gynecological exam   2. Vaginal odor   3. BV (bacterial vaginosis)   4. Encounter for  surveillance of injectable contraceptive       Plan: Rx flagyl 500 mg 1 bid x 7 days,with 1 refill. no alcohol, review handout on BV  Refilled depo provera 150 mg #1 with 4 refills  Pap and physical in 1 year Mammogram at 40

## 2017-10-08 NOTE — Patient Instructions (Signed)
Bacterial Vaginosis Bacterial vaginosis is a vaginal infection that occurs when the normal balance of bacteria in the vagina is disrupted. It results from an overgrowth of certain bacteria. This is the most common vaginal infection among women ages 15-44. Because bacterial vaginosis increases your risk for STIs (sexually transmitted infections), getting treated can help reduce your risk for chlamydia, gonorrhea, herpes, and HIV (human immunodeficiency virus). Treatment is also important for preventing complications in pregnant women, because this condition can cause an early (premature) delivery. What are the causes? This condition is caused by an increase in harmful bacteria that are normally present in small amounts in the vagina. However, the reason that the condition develops is not fully understood. What increases the risk? The following factors may make you more likely to develop this condition:  Having a new sexual partner or multiple sexual partners.  Having unprotected sex.  Douching.  Having an intrauterine device (IUD).  Smoking.  Drug and alcohol abuse.  Taking certain antibiotic medicines.  Being pregnant.  You cannot get bacterial vaginosis from toilet seats, bedding, swimming pools, or contact with objects around you. What are the signs or symptoms? Symptoms of this condition include:  Grey or white vaginal discharge. The discharge can also be watery or foamy.  A fish-like odor with discharge, especially after sexual intercourse or during menstruation.  Itching in and around the vagina.  Burning or pain with urination.  Some women with bacterial vaginosis have no signs or symptoms. How is this diagnosed? This condition is diagnosed based on:  Your medical history.  A physical exam of the vagina.  Testing a sample of vaginal fluid under a microscope to look for a large amount of bad bacteria or abnormal cells. Your health care provider may use a cotton swab  or a small wooden spatula to collect the sample.  How is this treated? This condition is treated with antibiotics. These may be given as a pill, a vaginal cream, or a medicine that is put into the vagina (suppository). If the condition comes back after treatment, a second round of antibiotics may be needed. Follow these instructions at home: Medicines  Take over-the-counter and prescription medicines only as told by your health care provider.  Take or use your antibiotic as told by your health care provider. Do not stop taking or using the antibiotic even if you start to feel better. General instructions  If you have a female sexual partner, tell her that you have a vaginal infection. She should see her health care provider and be treated if she has symptoms. If you have a female sexual partner, he does not need treatment.  During treatment: ? Avoid sexual activity until you finish treatment. ? Do not douche. ? Avoid alcohol as directed by your health care provider. ? Avoid breastfeeding as directed by your health care provider.  Drink enough water and fluids to keep your urine clear or pale yellow.  Keep the area around your vagina and rectum clean. ? Wash the area daily with warm water. ? Wipe yourself from front to back after using the toilet.  Keep all follow-up visits as told by your health care provider. This is important. How is this prevented?  Do not douche.  Wash the outside of your vagina with warm water only.  Use protection when having sex. This includes latex condoms and dental dams.  Limit how many sexual partners you have. To help prevent bacterial vaginosis, it is best to have sex with just   one partner (monogamous).  Make sure you and your sexual partner are tested for STIs.  Wear cotton or cotton-lined underwear.  Avoid wearing tight pants and pantyhose, especially during summer.  Limit the amount of alcohol that you drink.  Do not use any products that  contain nicotine or tobacco, such as cigarettes and e-cigarettes. If you need help quitting, ask your health care provider.  Do not use illegal drugs. Where to find more information:  Centers for Disease Control and Prevention: www.cdc.gov/std  American Sexual Health Association (ASHA): www.ashastd.org  U.S. Department of Health and Human Services, Office on Women's Health: www.womenshealth.gov/ or https://www.womenshealth.gov/a-z-topics/bacterial-vaginosis Contact a health care provider if:  Your symptoms do not improve, even after treatment.  You have more discharge or pain when urinating.  You have a fever.  You have pain in your abdomen.  You have pain during sex.  You have vaginal bleeding between periods. Summary  Bacterial vaginosis is a vaginal infection that occurs when the normal balance of bacteria in the vagina is disrupted.  Because bacterial vaginosis increases your risk for STIs (sexually transmitted infections), getting treated can help reduce your risk for chlamydia, gonorrhea, herpes, and HIV (human immunodeficiency virus). Treatment is also important for preventing complications in pregnant women, because the condition can cause an early (premature) delivery.  This condition is treated with antibiotic medicines. These may be given as a pill, a vaginal cream, or a medicine that is put into the vagina (suppository). This information is not intended to replace advice given to you by your health care provider. Make sure you discuss any questions you have with your health care provider. Document Released: 04/02/2005 Document Revised: 08/06/2016 Document Reviewed: 12/17/2015 Elsevier Interactive Patient Education  2018 Elsevier Inc.  

## 2017-10-11 ENCOUNTER — Other Ambulatory Visit: Payer: Managed Care, Other (non HMO) | Admitting: Adult Health

## 2017-12-18 ENCOUNTER — Encounter: Payer: Self-pay | Admitting: *Deleted

## 2017-12-18 ENCOUNTER — Other Ambulatory Visit: Payer: Self-pay

## 2017-12-18 ENCOUNTER — Ambulatory Visit (INDEPENDENT_AMBULATORY_CARE_PROVIDER_SITE_OTHER): Payer: Managed Care, Other (non HMO) | Admitting: *Deleted

## 2017-12-18 DIAGNOSIS — Z3202 Encounter for pregnancy test, result negative: Secondary | ICD-10-CM | POA: Diagnosis not present

## 2017-12-18 DIAGNOSIS — Z3042 Encounter for surveillance of injectable contraceptive: Secondary | ICD-10-CM | POA: Diagnosis not present

## 2017-12-18 LAB — POCT URINE PREGNANCY: Preg Test, Ur: NEGATIVE

## 2017-12-18 MED ORDER — MEDROXYPROGESTERONE ACETATE 150 MG/ML IM SUSP
150.0000 mg | Freq: Once | INTRAMUSCULAR | Status: AC
  Administered 2017-12-18: 150 mg via INTRAMUSCULAR

## 2017-12-18 NOTE — Progress Notes (Signed)
Pt given DepoProvera 150mg IM right deltoid without complications. Advised to return in 12 weeks for next injection. 

## 2018-03-18 ENCOUNTER — Ambulatory Visit (INDEPENDENT_AMBULATORY_CARE_PROVIDER_SITE_OTHER): Payer: Managed Care, Other (non HMO)

## 2018-03-18 ENCOUNTER — Encounter (INDEPENDENT_AMBULATORY_CARE_PROVIDER_SITE_OTHER): Payer: Self-pay

## 2018-03-18 VITALS — Ht 59.0 in | Wt 133.3 lb

## 2018-03-18 DIAGNOSIS — Z3042 Encounter for surveillance of injectable contraceptive: Secondary | ICD-10-CM | POA: Diagnosis not present

## 2018-03-18 DIAGNOSIS — Z3202 Encounter for pregnancy test, result negative: Secondary | ICD-10-CM | POA: Diagnosis not present

## 2018-03-18 LAB — POCT URINE PREGNANCY: PREG TEST UR: NEGATIVE

## 2018-03-18 MED ORDER — MEDROXYPROGESTERONE ACETATE 150 MG/ML IM SUSP
150.0000 mg | Freq: Once | INTRAMUSCULAR | Status: AC
  Administered 2018-03-18: 150 mg via INTRAMUSCULAR

## 2018-03-18 NOTE — Progress Notes (Signed)
Pt here for depo injection 150 mg IM given lt deltoid. Tolerated well. Return 12 weeks for next injection.pad CMA 

## 2018-04-22 ENCOUNTER — Ambulatory Visit: Payer: Self-pay | Admitting: Emergency Medicine

## 2018-04-22 ENCOUNTER — Encounter: Payer: Self-pay | Admitting: Emergency Medicine

## 2018-04-22 VITALS — BP 142/86 | HR 86 | Temp 98.4°F | Resp 14

## 2018-04-22 DIAGNOSIS — R300 Dysuria: Secondary | ICD-10-CM

## 2018-04-22 DIAGNOSIS — N3001 Acute cystitis with hematuria: Secondary | ICD-10-CM

## 2018-04-22 LAB — POCT URINALYSIS DIPSTICK
Bilirubin, UA: NEGATIVE
Glucose, UA: NEGATIVE
Ketones, UA: NEGATIVE
Nitrite, UA: POSITIVE
Protein, UA: POSITIVE — AB
Spec Grav, UA: 1.025 (ref 1.010–1.025)
Urobilinogen, UA: 0.2 E.U./dL
pH, UA: 5 (ref 5.0–8.0)

## 2018-04-22 MED ORDER — CEPHALEXIN 500 MG PO CAPS: 500.0000 mg | ORAL_CAPSULE | Freq: Two times a day (BID) | ORAL | 0 refills | Status: AC

## 2018-04-22 NOTE — Progress Notes (Signed)
Thank you good [facility]   Patient ID: Heather Dodson DOB: 38 y.o. MRN: [id number]   Subjective: This is a 38 y.o. female who presents today with UTI symptoms for 4 days.   She states this feels similar to her last UTI in 2018. + dysuria + frequency + urgency No hematuria No vaginal discharge No fever/chills No lower abdominal pain No nausea No vomiting No back pain No fatigue She denies chance of pregnancy.-On Depo-Provera injections regularly Has tried over-the-counter measures without improvement.   Pertinent items noted in HPI and remainder of comprehensive ROS otherwise negative.    Objective: Blood pressure (!) 142/86, pulse 86, temperature 98.4 F (36.9 C), temperature source Oral, resp. rate 14, SpO2 100 %.  Physical Exam Vitals signs and nursing note reviewed.  Constitutional:      General: She is not in acute distress. Eyes:     General: No scleral icterus. Neck:     Musculoskeletal: Neck supple.  Cardiovascular:     Rate and Rhythm: Normal rate and regular rhythm.     Heart sounds: Normal heart sounds.  Pulmonary:     Effort: No respiratory distress.     Breath sounds: Normal breath sounds.  Abdominal:     Palpations: Abdomen is soft. There is no mass.     Tenderness: There is no abdominal tenderness. There is no rebound.  Lymphadenopathy:     Cervical: No cervical adenopathy.  Skin:    General: Skin is warm and dry.  Neurological:     Mental Status: She is alert and oriented to person, place, and time.    Urinalysis: Positive for blood nitrate and leukocytes  Assessment: Uncomplicated acute cystitis/UTI   Plan: Treatment options discussed, as well as risks, benefits, alternatives. Patient voiced understanding and agreement with the following plans:    Treatment options discussed, as well as risks, benefits, alternatives. She has no drug allergies, but voiced that she prefers not to take  Cipro. Patient voiced understanding and agreement with the following plans:  New Prescriptions   CEPHALEXIN (KEFLEX) 500 MG CAPSULE    Take 1 capsule (500 mg total) by mouth 2 (two) times daily for 7 days.     Urine culture sent. Other advice given. Follow-up with your primary care doctor in 5-7 days if not improving, or sooner if symptoms become worse. Precautions discussed. Red flags discussed. Questions invited and answered. Patient voiced understanding and agreement.

## 2018-04-24 LAB — URINE CULTURE

## 2018-04-25 ENCOUNTER — Telehealth: Payer: Self-pay

## 2018-04-25 NOTE — Telephone Encounter (Signed)
The patient was contacted with the Positive Urine Culture results for E Coli. The provider advised that she continue to take the Cephalexin as prescribed and it should treat the infection. She was advised that if she is still experiencing symptoms or has further issues to F/U with her PCP or return to the clinic for repeat testing. She gave Verbal Understanding.

## 2018-06-10 ENCOUNTER — Ambulatory Visit: Payer: Managed Care, Other (non HMO)

## 2018-06-12 ENCOUNTER — Ambulatory Visit: Payer: Managed Care, Other (non HMO)

## 2018-06-13 ENCOUNTER — Ambulatory Visit (INDEPENDENT_AMBULATORY_CARE_PROVIDER_SITE_OTHER): Payer: Managed Care, Other (non HMO)

## 2018-06-13 VITALS — Ht 59.0 in | Wt 133.6 lb

## 2018-06-13 DIAGNOSIS — Z3202 Encounter for pregnancy test, result negative: Secondary | ICD-10-CM

## 2018-06-13 DIAGNOSIS — Z3042 Encounter for surveillance of injectable contraceptive: Secondary | ICD-10-CM

## 2018-06-13 LAB — POCT URINE PREGNANCY: Preg Test, Ur: NEGATIVE

## 2018-06-13 MED ORDER — MEDROXYPROGESTERONE ACETATE 150 MG/ML IM SUSP
150.0000 mg | Freq: Once | INTRAMUSCULAR | Status: AC
  Administered 2018-06-13: 150 mg via INTRAMUSCULAR

## 2018-06-13 NOTE — Progress Notes (Signed)
Pt here for depo injection 150 mg IM given rt deltoid. Tolerated well. Return 12 weeks for next injection. Pad CMA 

## 2018-06-25 ENCOUNTER — Ambulatory Visit: Payer: Managed Care, Other (non HMO) | Admitting: Adult Health

## 2018-06-25 ENCOUNTER — Encounter: Payer: Self-pay | Admitting: Adult Health

## 2018-06-25 ENCOUNTER — Other Ambulatory Visit: Payer: Self-pay

## 2018-06-25 VITALS — BP 130/78 | HR 95 | Temp 98.7°F | Resp 16

## 2018-06-25 DIAGNOSIS — J101 Influenza due to other identified influenza virus with other respiratory manifestations: Secondary | ICD-10-CM

## 2018-06-25 DIAGNOSIS — R6889 Other general symptoms and signs: Secondary | ICD-10-CM

## 2018-06-25 LAB — POCT INFLUENZA A/B
INFLUENZA A, POC: POSITIVE — AB
Influenza B, POC: NEGATIVE

## 2018-06-25 NOTE — Patient Instructions (Signed)

## 2018-06-25 NOTE — Progress Notes (Signed)
[facility] Subjective:     Patient ID: Heather Dodson, female   DOB: 02/12/1981, 38 y.o.   MRN: [id number]   Blood pressure 130/78, pulse 95, temperature 98.7 F (37.1 C), temperature source Oral, resp. rate 16, SpO2 100 %.  Patient is a 38 female in no acute distress who comes to the clinic for complaint of runny nose, body aches, chills, has not taken temperature. She reports mild chills at night.  Mild cough.  She reports worse symptom is nasal congestion.   Patient  denies rash, chest pain, shortness of breath, nausea, vomiting, or diarrhea.   Denies any exposure to Coronavirus regions affected on map located on  on Centers for Disease Control guidelines/web site and denies any exposure to any infected persons with coronavirus given recent [place] states outbreak and [place]  state of emergency is in  effect.    No Known Allergies  Patient Active Problem List   Diagnosis Date Noted  . Encounter for surveillance of injectable contraceptive 10/08/2017  . 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 Vaginal odor 06/22/2015  . BV (bacterial vaginosis) 06/22/2015  . Contraceptive management 06/22/2015    Current Outpatient Medications:  .  medroxyPROGESTERone (DEPO-PROVERA) 150 MG/ML injection, INJECT 1 ML INTO THE MUSCLE EVERY 11 TO 12 WEEKS, Disp: 1 mL, Rfl: 4  Influenza  This is a new problem. The current episode started in the past 7 days (saturday this past ). The problem occurs 2 to 4 times per day. The problem has been gradually improving. Associated symptoms include chills, congestion, coughing, fatigue, a fever, myalgias and a sore throat. Pertinent negatives include no abdominal pain, anorexia, arthralgias, change in bowel habit, chest pain, diaphoresis, headaches, joint swelling, nausea, neck pain, numbness, rash, swollen glands, urinary symptoms, vertigo, visual change, vomiting or weakness. The symptoms are aggravated by coughing. She has tried  rest for the symptoms. The treatment provided mild relief.     No Known Allergies   Review of Systems  Constitutional: Positive for chills, fatigue and fever. Negative for diaphoresis.  HENT: Positive for congestion and sore throat.   Respiratory: Positive for cough.   Cardiovascular: Negative for chest pain.  Gastrointestinal: Negative for abdominal pain, anorexia, change in bowel habit, nausea and vomiting.  Musculoskeletal: Positive for myalgias. Negative for arthralgias, joint swelling and neck pain.  Skin: Negative for rash.  Neurological: Negative for vertigo, weakness, numbness and headaches.   Alert and oriented x 4.  No respiratory distress. occasional cough in room.     Objective:   Physical Exam Constitutional:      Appearance: She is well-developed.  HENT:     Head: Normocephalic and atraumatic.     Jaw: There is normal jaw occlusion.     Salivary Glands: Right salivary gland is not diffusely enlarged or tender. Left salivary gland is not diffusely enlarged or tender.     Right Ear: Hearing, tympanic membrane, ear canal and external ear normal.     Left Ear: Hearing, tympanic membrane, ear canal and external ear normal.     Nose: Mucosal edema, congestion and rhinorrhea present. Rhinorrhea is clear.     Right Sinus: No maxillary sinus tenderness or frontal sinus tenderness.     Left Sinus: No maxillary sinus tenderness or frontal sinus tenderness.     Mouth/Throat:     Lips: Pink. No lesions.     Mouth: Mucous membranes are moist.     Pharynx: Uvula midline. Posterior oropharyngeal erythema present.  No oropharyngeal exudate or uvula swelling.     Tonsils: Swelling: 0 on the right. 0 on the left.  Eyes:     General: No scleral icterus.    Extraocular Movements: Extraocular movements intact.     Right eye: Normal extraocular motion and no nystagmus.     Left eye: Normal extraocular motion and no nystagmus.     Pupils: Pupils are equal.     Right eye: Pupil is  round and reactive.     Left eye: Pupil is round and reactive.  Neck:     Musculoskeletal: Normal range of motion and neck supple. No neck rigidity.  Cardiovascular:     Rate and Rhythm: Normal rate and regular rhythm.     Heart sounds: Normal heart sounds. No murmur. No friction rub. No gallop.   Pulmonary:     Effort: Pulmonary effort is normal. No respiratory distress.     Breath sounds: Normal breath sounds. No stridor. No wheezing, rhonchi or rales.  Chest:     Chest wall: No tenderness.  Abdominal:     Palpations: Abdomen is soft.  Musculoskeletal: Normal range of motion.        General: No swelling or tenderness.  Lymphadenopathy:     Cervical: No cervical adenopathy.  Skin:    General: Skin is warm and dry.     Capillary Refill: Capillary refill takes less than 2 seconds.  Neurological:     Mental Status: She is alert and oriented to person, place, and time.     GCS: GCS eye subscore is 4. GCS verbal subscore is 5. GCS motor subscore is 6.  Psychiatric:        Mood and Affect: Mood normal. Mood is not anxious or depressed.        Behavior: Behavior normal. Behavior is not agitated.        Cognition and Memory: Cognition is not impaired. Memory is not impaired.        Assessment:     Influenza A  Flu-like symptoms - Plan: POCT Influenza A/B (POC66)  Results for orders placed or performed in visit on 06/25/18 (from the past 24 hour(s))  POCT Influenza A/B (JME26)     Status: Abnormal   Collection Time: 06/25/18 11:07 AM  Result Value Ref Range   Influenza A, POC Positive (A) Negative   Influenza B, POC Negative Negative       Plan:     Patient declined Tamiflu and nasal spray.  She was given note for work and advised not to return until fever free 72 hours and symptoms are resolved.  Note given for 06/25/18 to 07/01/18 if needed.   Discussed RED FLAG SYMPTOMS and when to seek immediate care at medical facility.  Gave and reviewed After Visit Summary(AVS) with  patient. Patient is advised to read the after visit summary as well and let the provider know if any question, concerns or clarifications are needed.   Advised patient call the office or your primary care doctor for an appointment if no improvement within 72 hours or if any symptoms change or worsen at any time  Advised ER or urgent Care if after hours or on weekend. Call 911 for emergency symptoms at any time.Patinet verbalized understanding of all instructions given/reviewed and treatment plan and has no further questions or concerns at this time.    Patient verbalized understanding of all instructions given and denies any further questions at this time.

## 2018-09-05 ENCOUNTER — Other Ambulatory Visit: Payer: Self-pay

## 2018-09-05 ENCOUNTER — Ambulatory Visit (INDEPENDENT_AMBULATORY_CARE_PROVIDER_SITE_OTHER): Payer: Managed Care, Other (non HMO) | Admitting: *Deleted

## 2018-09-05 DIAGNOSIS — Z3042 Encounter for surveillance of injectable contraceptive: Secondary | ICD-10-CM | POA: Diagnosis not present

## 2018-09-05 MED ORDER — MEDROXYPROGESTERONE ACETATE 150 MG/ML IM SUSP
150.0000 mg | Freq: Once | INTRAMUSCULAR | Status: AC
  Administered 2018-09-05: 150 mg via INTRAMUSCULAR

## 2018-09-05 NOTE — Progress Notes (Signed)
Depo Provera 150 mg given IM in left deltoid. Patient tolerated well. Next dose in 12 weeks.  

## 2018-10-14 ENCOUNTER — Ambulatory Visit: Payer: Managed Care, Other (non HMO) | Admitting: Adult Health

## 2018-10-14 ENCOUNTER — Encounter: Payer: Self-pay | Admitting: Adult Health

## 2018-10-14 ENCOUNTER — Other Ambulatory Visit: Payer: Self-pay

## 2018-10-14 DIAGNOSIS — R399 Unspecified symptoms and signs involving the genitourinary system: Secondary | ICD-10-CM | POA: Diagnosis not present

## 2018-10-14 MED ORDER — CEPHALEXIN 500 MG PO CAPS: 500.0000 mg | ORAL_CAPSULE | Freq: Two times a day (BID) | ORAL | 0 refills | Status: DC

## 2018-10-14 MED ORDER — PHENAZOPYRIDINE HCL 97.2 MG PO TABS: 97.0000 mg | ORAL_TABLET | Freq: Three times a day (TID) | ORAL | 0 refills | Status: DC | PRN

## 2018-10-14 NOTE — Patient Instructions (Signed)
Cephalexin tablets or capsules °What is this medicine? °CEPHALEXIN (sef a LEX in) is a cephalosporin antibiotic. It is used to treat certain kinds of bacterial infections It will not work for colds, flu, or other viral infections. °This medicine may be used for other purposes; ask your health care provider or pharmacist if you have questions. °COMMON BRAND NAME(S): Biocef, Daxbia, Keflex, Keftab °What should I tell my health care provider before I take this medicine? °They need to know if you have any of these conditions: °· kidney disease °· stomach or intestine problems, especially colitis °· an unusual or allergic reaction to cephalexin, other cephalosporins, penicillins, other antibiotics, medicines, foods, dyes or preservatives °· pregnant or trying to get pregnant °· breast-feeding °How should I use this medicine? °Take this medicine by mouth with a full glass of water. Follow the directions on the prescription label. This medicine can be taken with or without food. Take your medicine at regular intervals. Do not take your medicine more often than directed. Take all of your medicine as directed even if you think you are better. Do not skip doses or stop your medicine early. °Talk to your pediatrician regarding the use of this medicine in children. While this drug may be prescribed for selected conditions, precautions do apply. °Overdosage: If you think you have taken too much of this medicine contact a poison control center or emergency room at once. °NOTE: This medicine is only for you. Do not share this medicine with others. °What if I miss a dose? °If you miss a dose, take it as soon as you can. If it is almost time for your next dose, take only that dose. Do not take double or extra doses. There should be at least 4 to 6 hours between doses. °What may interact with this medicine? °· probenecid °· some other antibiotics °This list may not describe all possible interactions. Give your health care provider a  list of all the medicines, herbs, non-prescription drugs, or dietary supplements you use. Also tell them if you smoke, drink alcohol, or use illegal drugs. Some items may interact with your medicine. °What should I watch for while using this medicine? °Tell your doctor or health care provider if your symptoms do not begin to improve in a few days. °This medicine may cause serious skin reactions. They can happen weeks to months after starting the medicine. Contact your health care provider right away if you notice fevers or flu-like symptoms with a rash. The rash may be red or purple and then turn into blisters or peeling of the skin. Or, you might notice a red rash with swelling of the face, lips or lymph nodes in your neck or under your arms. °Do not treat diarrhea with over the counter products. Contact your doctor if you have diarrhea that lasts more than 2 days or if it is severe and watery. °If you have diabetes, you may get a false-positive result for sugar in your urine. Check with your doctor or health care provider. °What side effects may I notice from receiving this medicine? °Side effects that you should report to your doctor or health care professional as soon as possible: °· allergic reactions like skin rash, itching or hives, swelling of the face, lips, or tongue °· breathing problems °· pain or trouble passing urine °· redness, blistering, peeling or loosening of the skin, including inside the mouth °· severe or watery diarrhea °· unusually weak or tired °· yellowing of the eyes, skin °Side   effects that usually do not require medical attention (report to your doctor or health care professional if they continue or are bothersome):  gas or heartburn  genital or anal irritation  headache  joint or muscle pain  nausea, vomiting This list may not describe all possible side effects. Call your doctor for medical advice about side effects. You may report side effects to FDA at 1-800-FDA-1088.  Where should I keep my medicine? Keep out of the reach of children. Store at room temperature between 59 and 86 degrees F (15 and 30 degrees C). Throw away any unused medicine after the expiration date. NOTE: This sheet is a summary. It may not cover all possible information. If you have questions about this medicine, talk to your doctor, pharmacist, or health care provider.  2020 Elsevier/Gold Standard (2018-07-11 07:00:28) Phenazopyridine tablets What is this medicine? PHENAZOPYRIDINE (fen az oh PEER i deen) is a pain reliever. It is used to stop the pain, burning, or discomfort caused by infection or irritation of the urinary tract. This medicine is not an antibiotic. It will not cure a urinary tract infection. This medicine may be used for other purposes; ask your health care provider or pharmacist if you have questions. COMMON BRAND NAME(S): AZO, Azo-100, Azo-Gesic, Azo-Septic, Azo-Standard, Phenazo, Prodium, Pyridium, Urinary Analgesic, Uristat, Uristat Ultra What should I tell my health care provider before I take this medicine? They need to know if you have any of these conditions:  glucose-6-phosphate dehydrogenase (G6PD) deficiency  kidney disease  an unusual or allergic reaction to phenazopyridine, other medicines, foods, dyes, or preservatives  pregnant or trying to get pregnant  breast-feeding How should I use this medicine? Take this medicine by mouth with a glass of water. Follow the directions on the prescription label. Take after meals. Take your doses at regular intervals. Do not take your medicine more often than directed. Do not skip doses or stop your medicine early even if you feel better. Do not stop taking except on your doctor's advice. Talk to your pediatrician regarding the use of this medicine in children. Special care may be needed. Overdosage: If you think you have taken too much of this medicine contact a poison control center or emergency room at once.  NOTE: This medicine is only for you. Do not share this medicine with others. What if I miss a dose? If you miss a dose, take it as soon as you can. If it is almost time for your next dose, take only that dose. Do not take double or extra doses. What may interact with this medicine? Interactions are not expected. This list may not describe all possible interactions. Give your health care provider a list of all the medicines, herbs, non-prescription drugs, or dietary supplements you use. Also tell them if you smoke, drink alcohol, or use illegal drugs. Some items may interact with your medicine. What should I watch for while using this medicine? Tell your doctor or health care professional if your symptoms do not improve or if they get worse. This medicine colors body fluids red. This effect is harmless and will go away after you are done taking the medicine. It will change urine to an dark orange or red color. The red color may stain clothing. Soft contact lenses may become permanently stained. It is best not to wear soft contact lenses while taking this medicine. If you are diabetic you may get a false positive result for sugar in your urine. Talk to your health care provider.  What side effects may I notice from receiving this medicine? Side effects that you should report to your doctor or health care professional as soon as possible:  allergic reactions like skin rash, itching or hives, swelling of the face, lips, or tongue  blue or purple color of the skin  difficulty breathing  fever  less urine  unusual bleeding, bruising  unusual tired, weak  vomiting  yellowing of the eyes or skin Side effects that usually do not require medical attention (report to your doctor or health care professional if they continue or are bothersome):  dark urine  headache  stomach upset This list may not describe all possible side effects. Call your doctor for medical advice about side effects. You  may report side effects to FDA at 1-800-FDA-1088. Where should I keep my medicine? Keep out of the reach of children. Store at room temperature between 15 and 30 degrees C (59 and 86 degrees F). Protect from light and moisture. Throw away any unused medicine after the expiration date. NOTE: This sheet is a summary. It may not cover all possible information. If you have questions about this medicine, talk to your doctor, pharmacist, or health care provider.  2020 Elsevier/Gold Standard (2007-10-30 11:04:07) Urinary Tract Infection, Adult A urinary tract infection (UTI) is an infection of any part of the urinary tract. The urinary tract includes:  The kidneys.  The ureters.  The bladder.  The urethra. These organs make, store, and get rid of pee (urine) in the body. What are the causes? This is caused by germs (bacteria) in your genital area. These germs grow and cause swelling (inflammation) of your urinary tract. What increases the risk? You are more likely to develop this condition if:  You have a small, thin tube (catheter) to drain pee.  You cannot control when you pee or poop (incontinence).  You are female, and: ? You use these methods to prevent pregnancy: ? A medicine that kills sperm (spermicide). ? A device that blocks sperm (diaphragm). ? You have low levels of a female hormone (estrogen). ? You are pregnant.  You have genes that add to your risk.  You are sexually active.  You take antibiotic medicines.  You have trouble peeing because of: ? A prostate that is bigger than normal, if you are female. ? A blockage in the part of your body that drains pee from the bladder (urethra). ? A kidney stone. ? A nerve condition that affects your bladder (neurogenic bladder). ? Not getting enough to drink. ? Not peeing often enough.  You have other conditions, such as: ? Diabetes. ? A weak disease-fighting system (immune system). ? Sickle cell disease. ? Gout. ?  Injury of the spine. What are the signs or symptoms? Symptoms of this condition include:  Needing to pee right away (urgently).  Peeing often.  Peeing small amounts often.  Pain or burning when peeing.  Blood in the pee.  Pee that smells bad or not like normal.  Trouble peeing.  Pee that is cloudy.  Fluid coming from the vagina, if you are female.  Pain in the belly or lower back. Other symptoms include:  Throwing up (vomiting).  No urge to eat.  Feeling mixed up (confused).  Being tired and grouchy (irritable).  A fever.  Watery poop (diarrhea). How is this treated? This condition may be treated with:  Antibiotic medicine.  Other medicines.  Drinking enough water. Follow these instructions at home:  Medicines  Take over-the-counter and  prescription medicines only as told by your doctor.  If you were prescribed an antibiotic medicine, take it as told by your doctor. Do not stop taking it even if you start to feel better. General instructions  Make sure you: ? Pee until your bladder is empty. ? Do not hold pee for a long time. ? Empty your bladder after sex. ? Wipe from front to back after pooping if you are a female. Use each tissue one time when you wipe.  Drink enough fluid to keep your pee pale yellow.  Keep all follow-up visits as told by your doctor. This is important. Contact a doctor if:  You do not get better after 1-2 days.  Your symptoms go away and then come back. Get help right away if:  You have very bad back pain.  You have very bad pain in your lower belly.  You have a fever.  You are sick to your stomach (nauseous).  You are throwing up. Summary  A urinary tract infection (UTI) is an infection of any part of the urinary tract.  This condition is caused by germs in your genital area.  There are many risk factors for a UTI. These include having a small, thin tube to drain pee and not being able to control when you pee  or poop.  Treatment includes antibiotic medicines for germs.  Drink enough fluid to keep your pee pale yellow. This information is not intended to replace advice given to you by your health care provider. Make sure you discuss any questions you have with your health care provider. Document Released: 09/19/2007 Document Revised: 03/20/2018 Document Reviewed: 10/10/2017 Elsevier Patient Education  2020 [employer]

## 2018-10-14 NOTE — Progress Notes (Signed)
Virtual Visit via Telephone Note  I connected with Heather Dodson on 10/14/18 at 10:30 AM EDT by telephone and verified that I am speaking with the correct person using two identifiers.  Location: Patient: at home  Provider: [facility] [place], [place], [place]     I discussed the limitations, risks, security and privacy concerns of performing an evaluation and management service by telephone and the availability of in person appointments. I also discussed with the patient that there may be a patient responsible charge related to this service. The patient expressed understanding and agreed to proceed.   History of Present Illness: Patient is a 38 year old female in no acute distress,    Urinary discomfort since 10/13/18 with frequency with " burning / Tingling " , she also has mild hesitancy that started today. She denies any other symptoms at this time.   No back and no abdominal pain.  She has had a history of urinary tract infections- none since 04/25/18 she was treated with Keflex - e coli - cleared per patient  and never over 3 infections  per year she reports.  Denies any visible hematuria.  Denies any new sexual partners.  Denies concerns of STD's or any vaginal symptoms.   Patient  denies any fever, body aches,chills, rash, chest pain, shortness of breath, nausea, vomiting, or diarrhea.   No Known Allergies   Observations/Objective:  Patient is alert and oriented and responsive to questions Engages in conversation with provider. Speaks in full sentences without any pauses without any shortness of breath or distress.    Assessment and Plan:  1. Urinary tract infection symptoms  Other orders - cephALEXin (KEFLEX) 500 MG capsule; Take 1 capsule (500 mg total) by mouth 2 (two) times daily. - phenazopyridine (PYRIDIUM) 97 MG tablet; Take 1 tablet (97 mg total) by mouth 3 (three) times daily as needed for up to 9 doses for pain (use no longer than  3 days).    Follow Up Instructions:  Advised patient call the office or your primary care doctor for an appointment if no improvement within 72 hours or if any symptoms change or worsen at any time  Advised ER or urgent Care if after hours or on weekend. Call 911 for emergency symptoms at any time.Patinet verbalized understanding of all instructions given/reviewed and treatment plan and has no further questions or concerns at this time.       I discussed the assessment and treatment plan with the patient. The patient was provided an opportunity to ask questions and all were answered. The patient agreed with the plan and demonstrated an understanding of the instructions.   The patient was advised to call back or seek an in-person evaluation if the symptoms worsen or if the condition fails to improve as anticipated.  I provided 15 minutes of non-face-to-face time during this encounter.   Marcille Buffy, FNP

## 2018-11-26 ENCOUNTER — Other Ambulatory Visit: Payer: Self-pay | Admitting: Adult Health

## 2018-11-28 ENCOUNTER — Ambulatory Visit: Payer: Managed Care, Other (non HMO)

## 2018-11-28 ENCOUNTER — Other Ambulatory Visit: Payer: Self-pay | Admitting: *Deleted

## 2018-11-28 MED ORDER — MEDROXYPROGESTERONE ACETATE 150 MG/ML IM SUSP: INTRAMUSCULAR | 4 refills | Status: DC

## 2018-12-01 ENCOUNTER — Ambulatory Visit (INDEPENDENT_AMBULATORY_CARE_PROVIDER_SITE_OTHER): Payer: Managed Care, Other (non HMO) | Admitting: *Deleted

## 2018-12-01 ENCOUNTER — Other Ambulatory Visit: Payer: Self-pay

## 2018-12-01 DIAGNOSIS — Z3042 Encounter for surveillance of injectable contraceptive: Secondary | ICD-10-CM

## 2018-12-01 MED ORDER — MEDROXYPROGESTERONE ACETATE 150 MG/ML IM SUSP
150.0000 mg | Freq: Once | INTRAMUSCULAR | Status: AC
  Administered 2018-12-01: 150 mg via INTRAMUSCULAR

## 2018-12-01 NOTE — Progress Notes (Signed)
Depo Provera 150 mg given IM in right deltoid. Patient tolerated well. Next dose in 12 weeks.  

## 2018-12-19 ENCOUNTER — Other Ambulatory Visit: Payer: Self-pay

## 2018-12-19 DIAGNOSIS — Z20822 Contact with and (suspected) exposure to covid-19: Secondary | ICD-10-CM

## 2018-12-20 LAB — NOVEL CORONAVIRUS, NAA: SARS-CoV-2, NAA: NOT DETECTED

## 2019-01-06 ENCOUNTER — Encounter: Payer: Self-pay | Admitting: Adult Health

## 2019-01-16 ENCOUNTER — Ambulatory Visit: Payer: Managed Care, Other (non HMO) | Admitting: Adult Health

## 2019-01-16 ENCOUNTER — Encounter: Payer: Self-pay | Admitting: Adult Health

## 2019-01-16 ENCOUNTER — Other Ambulatory Visit: Payer: Self-pay

## 2019-01-16 VITALS — BP 138/82 | HR 86 | Temp 97.3°F | Resp 16 | Ht 59.0 in | Wt 138.0 lb

## 2019-01-16 DIAGNOSIS — Z008 Encounter for other general examination: Secondary | ICD-10-CM | POA: Diagnosis not present

## 2019-01-16 DIAGNOSIS — R351 Nocturia: Secondary | ICD-10-CM | POA: Diagnosis not present

## 2019-01-16 DIAGNOSIS — R300 Dysuria: Secondary | ICD-10-CM | POA: Diagnosis not present

## 2019-01-16 LAB — POCT URINALYSIS DIPSTICK
Bilirubin, UA: NEGATIVE
Blood, UA: NEGATIVE
Glucose, UA: NEGATIVE
Ketones, UA: NEGATIVE
Leukocytes, UA: NEGATIVE
Nitrite, UA: NEGATIVE
Protein, UA: NEGATIVE
Spec Grav, UA: >=1.03 — AB (ref 1.010–1.025)
Urobilinogen, UA: 1 E.U./dL
pH, UA: 7 (ref 5.0–8.0)

## 2019-01-16 NOTE — Progress Notes (Signed)
Subjective:    Patient ID: Gerhard PerchesLatisha Eguia, female    DOB: 08-05-1980, 38 y.o.   MRN: [id number]  HPI 38  Yo female in non acute distress here for biometric screening. [employer] lives with  38 yo son.  Blood pressure 138/82, pulse 86, temperature (!) 97.3 F (36.3 C), temperature source Temporal, resp. rate 16, height 4\' 11"  (1.499 m), weight 138 lb (62.6 kg).  No Known Allergies  Current Outpatient Medications:  .  medroxyPROGESTERone (DEPO-PROVERA) 150 MG/ML injection, INJECT 1 ML INTO THE MUSCLE EVERY 11 TO 12 WEEKS, Disp: 1 mL, Rfl: 4   Past Medical History:  Diagnosis Date  . BV (bacterial vaginosis) 06/22/2015  . 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 Contraceptive management 06/22/2015  . Vaginal odor 06/22/2015  . Vaginal Pap smear, abnormal    No past surgical history on file.  Social History   Socioeconomic History  . Marital status: Unknown    Spouse name: Not on file  . Number of children: 1  . Years of education: Not on file  . Highest education level: Not on file  Occupational History  . Not on file  Social Needs  . Financial resource strain: Not on file  . Food insecurity    Worry: Not on file    Inability: Not on file  . Transportation needs    Medical: Not on file    Non-medical: Not on file  Tobacco Use  . Smoking status: Never Smoker  . Smokeless tobacco: Never Used  Substance and Sexual Activity  . Alcohol use: Yes    Comment: occ  . Drug use: No  . Sexual activity: Yes    Birth control/protection: Injection  Lifestyle  . Physical activity    Days per week: Not on file    Minutes per session: Not on file  . Stress: Not on file  Relationships  . Social Musicianconnections    Talks on phone: Not on file    Gets together: Not on file    Attends religious service: Not on file    Active member of club or organization: Not on file    Attends meetings of clubs or organizations: Not on file    Relationship status: Not on file  . Intimate  partner violence    Fear of current or ex partner: Not on file    Emotionally abused: Not on file    Physically abused: Not on file    Forced sexual activity: Not on file  Other Topics Concern  . Not on file  Social History Narrative  . Not on file   Family History  Problem Relation Age of Onset  . Hypertension Mother   . Other Mother        had kidney removed due to kidney failure  . Cancer Mother        breast  . Hypertension Father   . Hyperlipidemia Father   . Diabetes Father   . Cancer Father        prostate  . Hypertension Sister   . Cancer Paternal Grandmother   . Cancer Paternal Grandfather    Review of Systems  Genitourinary: Positive for frequency (at night only  gets up  2-3 times /night, last drinks  9:30-10pm). Negative for decreased urine volume, difficulty urinating, dysuria, hematuria and urgency.  All other systems reviewed and are negative.      Objective:   Physical Exam Vitals signs and nursing note reviewed.  Constitutional:  Appearance: Normal appearance. She is well-developed, well-groomed and normal weight.  HENT:     Head: Normocephalic and atraumatic.     Jaw: There is normal jaw occlusion.     Right Ear: Hearing, ear canal and external ear normal. There is impacted cerumen.     Left Ear: Hearing, ear canal and external ear normal. There is impacted cerumen.     Nose: Nose normal.     Mouth/Throat:     Lips: Pink.     Mouth: Mucous membranes are moist.     Pharynx: Oropharynx is clear.  Eyes:     General: Lids are normal. Vision grossly intact.     Extraocular Movements: Extraocular movements intact.     Conjunctiva/sclera: Conjunctivae normal.     Pupils: Pupils are equal, round, and reactive to light.     Funduscopic exam:    Right eye: Red reflex present.        Left eye: Red reflex present. Neck:     Musculoskeletal: Normal range of motion and neck supple.     Thyroid: No thyromegaly.     Trachea: Trachea normal.   Cardiovascular:     Rate and Rhythm: Normal rate and regular rhythm.     Pulses: Normal pulses.          Carotid pulses are 2+ on the right side and 2+ on the left side.      Radial pulses are 2+ on the right side and 2+ on the left side.       Posterior tibial pulses are 2+ on the right side and 2+ on the left side.     Heart sounds: Normal heart sounds.  Pulmonary:     Effort: Pulmonary effort is normal.     Breath sounds: Normal breath sounds.  Abdominal:     General: Abdomen is flat. Bowel sounds are normal.     Palpations: Abdomen is soft.     Tenderness: There is no right CVA tenderness or left CVA tenderness.     Hernia: No hernia is present.  Musculoskeletal:     Right lower leg: No edema.     Left lower leg: No edema.  Lymphadenopathy:     Head:     Right side of head: No submental, submandibular, tonsillar, preauricular, posterior auricular or occipital adenopathy.     Left side of head: No submental, submandibular, tonsillar, preauricular, posterior auricular or occipital adenopathy.     Cervical: No cervical adenopathy.     Right cervical: No superficial, deep or posterior cervical adenopathy.    Left cervical: No superficial, deep or posterior cervical adenopathy.     Upper Body:     Right upper body: No supraclavicular adenopathy.     Left upper body: No supraclavicular adenopathy.  Skin:    General: Skin is warm and dry.     Capillary Refill: Capillary refill takes less than 2 seconds.  Neurological:     General: No focal deficit present.     Mental Status: She is alert and oriented to person, place, and time.     GCS: GCS eye subscore is 4. GCS verbal subscore is 5. GCS motor subscore is 6.     Cranial Nerves: Cranial nerves are intact.     Sensory: Sensation is intact.     Motor: Motor function is intact.     Coordination: Coordination is intact. Romberg sign negative.     Deep Tendon Reflexes:     Reflex Scores:  Patellar reflexes are 2+ on the right  side and 2+ on the left side. Psychiatric:        Attention and Perception: Attention and perception normal.        Mood and Affect: Mood normal.        Speech: Speech normal.        Behavior: Behavior normal. Behavior is cooperative.        Thought Content: Thought content normal.        Cognition and Memory: Cognition and memory normal.        Judgment: Judgment normal.     Recent Results (from the past 2160 hour(s))  Novel Coronavirus, NAA ([facility])     Status: None   Collection Time: 12/19/18 12:00 AM   Specimen: Nasopharyngeal(NP) swabs in vial transport medium   NASOPHARYNGE  TESTING  Result Value Ref Range   SARS-CoV-2, NAA Not Detected Not Detected    Comment: This nucleic acid amplification test was developed and its performance characteristics determined by [employer]. Nucleic acid amplification tests include PCR and TMA. This test has not been FDA cleared or approved. This test has been authorized by FDA under an Emergency Use Authorization (EUA). This test is only authorized for the duration of time the declaration that circumstances exist justifying the authorization of the emergency use of in vitro diagnostic tests for detection of SARS-CoV-2 virus and/or diagnosis of COVID-19 infection under section 564(b)(1) of the Act, 21 U.S.C. 619JKD-3(O) (1), unless the authorization is terminated or revoked sooner. When diagnostic testing is negative, the possibility of a false negative result should be considered in the context of a patient's recent exposures and the presence of clinical signs and symptoms consistent with COVID-19. An individual without symptoms of COVID-19 and who is not shedding SARS-CoV-2 virus would  expect to have a negative (not detected) result in this assay.   POCT Urinalysis Dipstick (CPT 81002)     Status: Abnormal   Collection Time: 01/16/19 10:05 AM  Result Value Ref Range   Color, UA Yellow    Clarity, UA Clear    Glucose, UA  Negative Negative   Bilirubin, UA Negative    Ketones, UA Negative    Spec Grav, UA >=1.030 (A) 1.010 - 1.025   Blood, UA Negative    pH, UA 7.0 5.0 - 8.0   Protein, UA Negative Negative   Urobilinogen, UA 1.0 0.2 or 1.0 E.U./dL   Nitrite, UA Negative    Leukocytes, UA Negative Negative   Appearance     Odor      Sent for culture and sensitiviity    Assessment & Plan:  Biometric Screening Nocturia more than twice a night. Orders Placed This Encounter  Procedures  . Urine Culture  . Glucose, random  . VITAMIN D 25 Hydroxy (Vit-D Deficiency, Fractures)  . Lipid Panel With LDL/HDL Ratio  . POCT Urinalysis Dipstick (CPT 81002)  Will contact patient with test results once received. She verbalizes understanding and has no questions at discharge. Pending appointment with OB/GYN in December, pushed back due to Covid-19.

## 2019-01-16 NOTE — Addendum Note (Signed)
Addended by: Judie Petit on: 01/16/2019 02:28 PM   Modules accepted: Orders

## 2019-01-17 LAB — LIPID PANEL
Chol/HDL Ratio: 3 ratio (ref 0.0–4.4)
Cholesterol, Total: 155 mg/dL (ref 100–199)
HDL: 52 mg/dL (ref >39)
LDL Chol Calc (NIH): 94 mg/dL (ref 0–99)
Triglycerides: 40 mg/dL (ref 0–149)
VLDL Cholesterol Cal: 9 mg/dL (ref 5–40)

## 2019-01-17 LAB — COMPREHENSIVE METABOLIC PANEL
ALT: 11 IU/L (ref 0–32)
AST: 13 IU/L (ref 0–40)
Albumin/Globulin Ratio: 1.6 (ref 1.2–2.2)
Albumin: 4.4 g/dL (ref 3.8–4.8)
Alkaline Phosphatase: 73 IU/L (ref 39–117)
BUN/Creatinine Ratio: 17 (ref 9–23)
BUN: 15 mg/dL (ref 6–20)
Bilirubin Total: 0.6 mg/dL (ref 0.0–1.2)
CO2: 22 mmol/L (ref 20–29)
Calcium: 9.3 mg/dL (ref 8.7–10.2)
Chloride: 106 mmol/L (ref 96–106)
Creatinine, Ser: 0.89 mg/dL (ref 0.57–1.00)
GFR calc Af Amer: 95 mL/min/{1.73_m2} (ref >59)
GFR calc non Af Amer: 82 mL/min/{1.73_m2} (ref >59)
Globulin, Total: 2.7 g/dL (ref 1.5–4.5)
Glucose: 85 mg/dL (ref 65–99)
Potassium: 4.1 mmol/L (ref 3.5–5.2)
Sodium: 141 mmol/L (ref 134–144)
Total Protein: 7.1 g/dL (ref 6.0–8.5)

## 2019-01-17 LAB — VITAMIN D 25 HYDROXY (VIT D DEFICIENCY, FRACTURES): Vit D, 25-Hydroxy: 22.3 ng/mL — ABNORMAL LOW (ref 30.0–100.0)

## 2019-01-19 ENCOUNTER — Encounter: Payer: Self-pay | Admitting: Adult Health

## 2019-01-19 NOTE — Progress Notes (Signed)
Results sent to patient in Mychart message. Vitamin D 2,000 International units by mouth daily and recheck with primary care vitamin d level in 2 months. Kidney/ liver function within normal limits per labs. Cholesterol and glucose normal. Follow up with primary care if urinary symptoms persist longer than three days. Education hand out's sent to My Chart message as well. Urine culture is still pending.

## 2019-01-22 LAB — URINE CULTURE

## 2019-02-23 ENCOUNTER — Ambulatory Visit: Payer: Managed Care, Other (non HMO)

## 2019-02-25 ENCOUNTER — Ambulatory Visit: Payer: Managed Care, Other (non HMO)

## 2019-02-27 ENCOUNTER — Ambulatory Visit: Payer: Managed Care, Other (non HMO)

## 2019-03-02 ENCOUNTER — Other Ambulatory Visit: Payer: Self-pay

## 2019-03-02 ENCOUNTER — Ambulatory Visit (INDEPENDENT_AMBULATORY_CARE_PROVIDER_SITE_OTHER): Payer: Managed Care, Other (non HMO) | Admitting: *Deleted

## 2019-03-02 DIAGNOSIS — Z3042 Encounter for surveillance of injectable contraceptive: Secondary | ICD-10-CM | POA: Diagnosis not present

## 2019-03-02 MED ORDER — MEDROXYPROGESTERONE ACETATE 150 MG/ML IM SUSP
150.0000 mg | Freq: Once | INTRAMUSCULAR | Status: AC
  Administered 2019-03-02: 150 mg via INTRAMUSCULAR

## 2019-03-02 NOTE — Progress Notes (Signed)
   NURSE VISIT- INJECTION  SUBJECTIVE:  Heather Dodson is a 38 y.o. G3P1021 female here for a Depo Provera for contraception/period management. She is a GYN patient.   OBJECTIVE:  There were no vitals taken for this visit.  Appears well, in no apparent distress  Injection administered in: Left deltoid  Meds ordered this encounter  Medications  . medroxyPROGESTERone (DEPO-PROVERA) injection 150 mg    ASSESSMENT: GYN patient Depo Provera for contraception/period management PLAN: Follow-up: in 11-13 weeks for next Depo   Rolena Infante  03/02/2019 12:06 PM

## 2019-03-16 ENCOUNTER — Telehealth: Payer: Self-pay | Admitting: *Deleted

## 2019-03-16 NOTE — Telephone Encounter (Signed)
Pt is requesting a prescription for yeast. Please advise. Thanks!! [place]

## 2019-03-17 MED ORDER — FLUCONAZOLE 150 MG PO TABS: ORAL_TABLET | ORAL | 1 refills | Status: DC

## 2019-03-17 NOTE — Telephone Encounter (Signed)
rx for diflucan sent

## 2019-03-17 NOTE — Addendum Note (Signed)
Addended by: Derrek Monaco A on: 03/17/2019 08:13 AM   Modules accepted: Orders

## 2019-05-05 ENCOUNTER — Other Ambulatory Visit: Payer: Self-pay

## 2019-05-05 ENCOUNTER — Ambulatory Visit: Payer: Managed Care, Other (non HMO) | Attending: Internal Medicine

## 2019-05-05 DIAGNOSIS — Z20822 Contact with and (suspected) exposure to covid-19: Secondary | ICD-10-CM

## 2019-05-06 LAB — NOVEL CORONAVIRUS, NAA: SARS-CoV-2, NAA: NOT DETECTED

## 2019-05-07 ENCOUNTER — Other Ambulatory Visit (HOSPITAL_COMMUNITY)
Admission: RE | Admit: 2019-05-07 | Discharge: 2019-05-07 | Disposition: A | Discharge status: Home / Self Care | Payer: Managed Care, Other (non HMO) | Source: Ambulatory Visit | Attending: Adult Health | Admitting: Adult Health

## 2019-05-07 ENCOUNTER — Ambulatory Visit (INDEPENDENT_AMBULATORY_CARE_PROVIDER_SITE_OTHER): Payer: Managed Care, Other (non HMO) | Admitting: Adult Health

## 2019-05-07 ENCOUNTER — Other Ambulatory Visit: Payer: Self-pay

## 2019-05-07 ENCOUNTER — Encounter: Payer: Self-pay | Admitting: Adult Health

## 2019-05-07 VITALS — BP 150/100 | HR 93 | Ht 59.0 in | Wt 134.2 lb

## 2019-05-07 DIAGNOSIS — Z01419 Encounter for gynecological examination (general) (routine) without abnormal findings: Secondary | ICD-10-CM | POA: Insufficient documentation

## 2019-05-07 DIAGNOSIS — R030 Elevated blood-pressure reading, without diagnosis of hypertension: Secondary | ICD-10-CM

## 2019-05-07 DIAGNOSIS — Z3042 Encounter for surveillance of injectable contraceptive: Secondary | ICD-10-CM

## 2019-05-07 MED ORDER — HYDROCHLOROTHIAZIDE 12.5 MG PO CAPS: 12.5000 mg | ORAL_CAPSULE | Freq: Every day | ORAL | 3 refills | Status: DC

## 2019-05-07 MED ORDER — MEDROXYPROGESTERONE ACETATE 150 MG/ML IM SUSP: INTRAMUSCULAR | 4 refills | Status: DC

## 2019-05-07 MED ORDER — AMLODIPINE BESYLATE 5 MG PO TABS: 5.0000 mg | ORAL_TABLET | Freq: Every day | ORAL | 3 refills | Status: DC

## 2019-05-07 NOTE — Patient Instructions (Addendum)
DASH Eating Plan °DASH stands for "Dietary Approaches to Stop Hypertension." The DASH eating plan is a healthy eating plan that has been shown to reduce high blood pressure (hypertension). It may also reduce your risk for type 2 diabetes, heart disease, and stroke. The DASH eating plan may also help with weight loss. °What are tips for following this plan? ° °General guidelines °· Avoid eating more than 2,300 mg (milligrams) of salt (sodium) a day. If you have hypertension, you may need to reduce your sodium intake to 1,500 mg a day. °· Limit alcohol intake to no more than 1 drink a day for nonpregnant women and 2 drinks a day for men. One drink equals 12 oz of beer, 5 oz of wine, or 1½ oz of hard liquor. °· Work with your health care provider to maintain a healthy body weight or to lose weight. Ask what an ideal weight is for you. °· Get at least 30 minutes of exercise that causes your heart to beat faster (aerobic exercise) most days of the week. Activities may include walking, swimming, or biking. °· Work with your health care provider or diet and nutrition specialist (dietitian) to adjust your eating plan to your individual calorie needs. °Reading food labels ° °· Check food labels for the amount of sodium per serving. Choose foods with less than 5 percent of the Daily Value of sodium. Generally, foods with less than 300 mg of sodium per serving fit into this eating plan. °· To find whole grains, look for the word "whole" as the first word in the ingredient list. °Shopping °· Buy products labeled as "low-sodium" or "no salt added." °· Buy fresh foods. Avoid canned foods and premade or frozen meals. °Cooking °· Avoid adding salt when cooking. Use salt-free seasonings or herbs instead of table salt or sea salt. Check with your health care provider or pharmacist before using salt substitutes. °· Do not fry foods. Cook foods using healthy methods such as baking, boiling, grilling, and broiling instead. °· Cook with  heart-healthy oils, such as olive, canola, soybean, or sunflower oil. °Meal planning °· Eat a balanced diet that includes: °? 5 or more servings of fruits and vegetables each day. At each meal, try to fill half of your plate with fruits and vegetables. °? Up to 6-8 servings of whole grains each day. °? Less than 6 oz of lean meat, poultry, or fish each day. A 3-oz serving of meat is about the same size as a deck of cards. One egg equals 1 oz. °? 2 servings of low-fat dairy each day. °? A serving of nuts, seeds, or beans 5 times each week. °? Heart-healthy fats. Healthy fats called Omega-3 fatty acids are found in foods such as flaxseeds and coldwater fish, like sardines, salmon, and mackerel. °· Limit how much you eat of the following: °? Canned or prepackaged foods. °? Food that is high in trans fat, such as fried foods. °? Food that is high in saturated fat, such as fatty meat. °? Sweets, desserts, sugary drinks, and other foods with added sugar. °? Full-fat dairy products. °· Do not salt foods before eating. °· Try to eat at least 2 vegetarian meals each week. °· Eat more home-cooked food and less restaurant, buffet, and fast food. °· When eating at a restaurant, ask that your food be prepared with less salt or no salt, if possible. °What foods are recommended? °The items listed may not be a complete list. Talk with your dietitian about   what dietary choices are best for you. °Grains °Whole-grain or whole-wheat bread. Whole-grain or whole-wheat pasta. Brown rice. Oatmeal. Quinoa. Bulgur. Whole-grain and low-sodium cereals. Pita bread. Low-fat, low-sodium crackers. Whole-wheat flour tortillas. °Vegetables °Fresh or frozen vegetables (raw, steamed, roasted, or grilled). Low-sodium or reduced-sodium tomato and vegetable juice. Low-sodium or reduced-sodium tomato sauce and tomato paste. Low-sodium or reduced-sodium canned vegetables. °Fruits °All fresh, dried, or frozen fruit. Canned fruit in natural juice (without  added sugar). °Meat and other protein foods °Skinless chicken or turkey. Ground chicken or turkey. Pork with fat trimmed off. Fish and seafood. Egg whites. Dried beans, peas, or lentils. Unsalted nuts, nut butters, and seeds. Unsalted canned beans. Lean cuts of beef with fat trimmed off. Low-sodium, lean deli meat. °Dairy °Low-fat (1%) or fat-free (skim) milk. Fat-free, low-fat, or reduced-fat cheeses. Nonfat, low-sodium ricotta or cottage cheese. Low-fat or nonfat yogurt. Low-fat, low-sodium cheese. °Fats and oils °Soft margarine without trans fats. Vegetable oil. Low-fat, reduced-fat, or light mayonnaise and salad dressings (reduced-sodium). Canola, safflower, olive, soybean, and sunflower oils. Avocado. °Seasoning and other foods °Herbs. Spices. Seasoning mixes without salt. Unsalted popcorn and pretzels. Fat-free sweets. °What foods are not recommended? °The items listed may not be a complete list. Talk with your dietitian about what dietary choices are best for you. °Grains °Baked goods made with fat, such as croissants, muffins, or some breads. Dry pasta or rice meal packs. °Vegetables °Creamed or fried vegetables. Vegetables in a cheese sauce. Regular canned vegetables (not low-sodium or reduced-sodium). Regular canned tomato sauce and paste (not low-sodium or reduced-sodium). Regular tomato and vegetable juice (not low-sodium or reduced-sodium). Pickles. Olives. °Fruits °Canned fruit in a light or heavy syrup. Fried fruit. Fruit in cream or butter sauce. °Meat and other protein foods °Fatty cuts of meat. Ribs. Fried meat. Bacon. Sausage. Bologna and other processed lunch meats. Salami. Fatback. Hotdogs. Bratwurst. Salted nuts and seeds. Canned beans with added salt. Canned or smoked fish. Whole eggs or egg yolks. Chicken or turkey with skin. °Dairy °Whole or 2% milk, cream, and half-and-half. Whole or full-fat cream cheese. Whole-fat or sweetened yogurt. Full-fat cheese. Nondairy creamers. Whipped toppings.  Processed cheese and cheese spreads. °Fats and oils °Butter. Stick margarine. Lard. Shortening. Ghee. Bacon fat. Tropical oils, such as coconut, palm kernel, or palm oil. °Seasoning and other foods °Salted popcorn and pretzels. Onion salt, garlic salt, seasoned salt, table salt, and sea salt. Worcestershire sauce. Tartar sauce. Barbecue sauce. Teriyaki sauce. Soy sauce, including reduced-sodium. Steak sauce. Canned and packaged gravies. Fish sauce. Oyster sauce. Cocktail sauce. Horseradish that you find on the shelf. Ketchup. Mustard. Meat flavorings and tenderizers. Bouillon cubes. Hot sauce and Tabasco sauce. Premade or packaged marinades. Premade or packaged taco seasonings. Relishes. Regular salad dressings. °Where to find more information: °· National Heart, Lung, and Blood Institute: www.nhlbi.nih.gov °· American Heart Association: www.heart.org °Summary °· The DASH eating plan is a healthy eating plan that has been shown to reduce high blood pressure (hypertension). It may also reduce your risk for type 2 diabetes, heart disease, and stroke. °· With the DASH eating plan, you should limit salt (sodium) intake to 2,300 mg a day. If you have hypertension, you may need to reduce your sodium intake to 1,500 mg a day. °· When on the DASH eating plan, aim to eat more fresh fruits and vegetables, whole grains, lean proteins, low-fat dairy, and heart-healthy fats. °· Work with your health care provider or diet and nutrition specialist (dietitian) to adjust your eating plan to your   individual calorie needs. °This information is not intended to replace advice given to you by your health care provider. Make sure you discuss any questions you have with your health care provider. °Document Revised: 03/15/2017 Document Reviewed: 03/26/2016 °Elsevier Patient Education © 2020 [employer]. °Hypertension, Adult °High blood pressure (hypertension) is when the force of blood pumping through the arteries is too strong. The  arteries are the blood vessels that carry blood from the heart throughout the body. Hypertension forces the heart to work harder to pump blood and may cause arteries to become narrow or stiff. Untreated or uncontrolled hypertension can cause a heart attack, heart failure, a stroke, kidney disease, and other problems. °A blood pressure reading consists of a higher number over a lower number. Ideally, your blood pressure should be below 120/80. The first ("top") number is called the systolic pressure. It is a measure of the pressure in your arteries as your heart beats. The second ("bottom") number is called the diastolic pressure. It is a measure of the pressure in your arteries as the heart relaxes. °What are the causes? °The exact cause of this condition is not known. There are some conditions that result in or are related to high blood pressure. °What increases the risk? °Some risk factors for high blood pressure are under your control. The following factors may make you more likely to develop this condition: °· Smoking. °· Having type 2 diabetes mellitus, high cholesterol, or both. °· Not getting enough exercise or physical activity. °· Being overweight. °· Having too much fat, sugar, calories, or salt (sodium) in your diet. °· Drinking too much alcohol. °Some risk factors for high blood pressure may be difficult or impossible to change. Some of these factors include: °· Having chronic kidney disease. °· Having a family history of high blood pressure. °· Age. Risk increases with age. °· Race. You may be at higher risk if you are African American. °· Gender. Men are at higher risk than women before age 45. After age 65, women are at higher risk than men. °· Having obstructive sleep apnea. °· Stress. °What are the signs or symptoms? °High blood pressure may not cause symptoms. Very high blood pressure (hypertensive crisis) may cause: °· Headache. °· Anxiety. °· Shortness of breath. °· Nosebleed. °· Nausea and  vomiting. °· Vision changes. °· Severe chest pain. °· Seizures. °How is this diagnosed? °This condition is diagnosed by measuring your blood pressure while you are seated, with your arm resting on a flat surface, your legs uncrossed, and your feet flat on the floor. The cuff of the blood pressure monitor will be placed directly against the skin of your upper arm at the level of your heart. It should be measured at least twice using the same arm. Certain conditions can cause a difference in blood pressure between your right and left arms. °Certain factors can cause blood pressure readings to be lower or higher than normal for a short period of time: °· When your blood pressure is higher when you are in a health care provider's office than when you are at home, this is called white coat hypertension. Most people with this condition do not need medicines. °· When your blood pressure is higher at home than when you are in a health care provider's office, this is called masked hypertension. Most people with this condition may need medicines to control blood pressure. °If you have a high blood pressure reading during one visit or you have normal blood pressure   with other risk factors, you may be asked to: °· Return on a different day to have your blood pressure checked again. °· Monitor your blood pressure at home for 1 week or longer. °If you are diagnosed with hypertension, you may have other blood or imaging tests to help your health care provider understand your overall risk for other conditions. °How is this treated? °This condition is treated by making healthy lifestyle changes, such as eating healthy foods, exercising more, and reducing your alcohol intake. Your health care provider may prescribe medicine if lifestyle changes are not enough to get your blood pressure under control, and if: °· Your systolic blood pressure is above 130. °· Your diastolic blood pressure is above 80. °Your personal target blood  pressure may vary depending on your medical conditions, your age, and other factors. °Follow these instructions at home: °Eating and drinking ° °· Eat a diet that is high in fiber and potassium, and low in sodium, added sugar, and fat. An example eating plan is called the DASH (Dietary Approaches to Stop Hypertension) diet. To eat this way: °? Eat plenty of fresh fruits and vegetables. Try to fill one half of your plate at each meal with fruits and vegetables. °? Eat whole grains, such as whole-wheat pasta, brown rice, or whole-grain bread. Fill about one fourth of your plate with whole grains. °? Eat or drink low-fat dairy products, such as skim milk or low-fat yogurt. °? Avoid fatty cuts of meat, processed or cured meats, and poultry with skin. Fill about one fourth of your plate with lean proteins, such as fish, chicken without skin, beans, eggs, or tofu. °? Avoid pre-made and processed foods. These tend to be higher in sodium, added sugar, and fat. °· Reduce your daily sodium intake. Most people with hypertension should eat less than 1,500 mg of sodium a day. °· Do not drink alcohol if: °? Your health care provider tells you not to drink. °? You are pregnant, may be pregnant, or are planning to become pregnant. °· If you drink alcohol: °? Limit how much you use to: °§ 0-1 drink a day for women. °§ 0-2 drinks a day for men. °? Be aware of how much alcohol is in your drink. In the [place] one drink equals one 12 oz bottle of beer (355 mL), one 5 oz glass of wine (148 mL), or one 1½ oz glass of hard liquor (44 mL). °Lifestyle ° °· Work with your health care provider to maintain a healthy body weight or to lose weight. Ask what an ideal weight is for you. °· Get at least 30 minutes of exercise most days of the week. Activities may include walking, swimming, or biking. °· Include exercise to strengthen your muscles (resistance exercise), such as Pilates or lifting weights, as part of your weekly exercise routine. Try  to do these types of exercises for 30 minutes at least 3 days a week. °· Do not use any products that contain nicotine or tobacco, such as cigarettes, e-cigarettes, and chewing tobacco. If you need help quitting, ask your health care provider. °· Monitor your blood pressure at home as told by your health care provider. °· Keep all follow-up visits as told by your health care provider. This is important. °Medicines °· Take over-the-counter and prescription medicines only as told by your health care provider. Follow directions carefully. Blood pressure medicines must be taken as prescribed. °· Do not skip doses of blood pressure medicine. Doing this puts you at   risk for problems and can make the medicine less effective. °· Ask your health care provider about side effects or reactions to medicines that you should watch for. °Contact a health care provider if you: °· Think you are having a reaction to a medicine you are taking. °· Have headaches that keep coming back (recurring). °· Feel dizzy. °· Have swelling in your ankles. °· Have trouble with your vision. °Get help right away if you: °· Develop a severe headache or confusion. °· Have unusual weakness or numbness. °· Feel faint. °· Have severe pain in your chest or abdomen. °· Vomit repeatedly. °· Have trouble breathing. °Summary °· Hypertension is when the force of blood pumping through your arteries is too strong. If this condition is not controlled, it may put you at risk for serious complications. °· Your personal target blood pressure may vary depending on your medical conditions, your age, and other factors. For most people, a normal blood pressure is less than 120/80. °· Hypertension is treated with lifestyle changes, medicines, or a combination of both. Lifestyle changes include losing weight, eating a healthy, low-sodium diet, exercising more, and limiting alcohol. °This information is not intended to replace advice given to you by your health care  provider. Make sure you discuss any questions you have with your health care provider. °Document Revised: 12/11/2017 Document Reviewed: 12/11/2017 °Elsevier Patient Education © 2020 Elsevier Inc. ° °

## 2019-05-07 NOTE — Progress Notes (Signed)
Patient ID: Heather Dodson, female   DOB: 07-06-1980, 39 y.o.   MRN: [id number] History of Present Illness: Emine is a 39 year old black female, single, [id number], in for a well woman gyn exam and pap and get refills on depo. BP has been elevated at clinic at work,the last 2-4 months,she  works at [employer]. She had labs in October and all normal except vitam in D low.   Current Medications, Allergies, Past Medical History, Past Surgical History, Family History and Social History were reviewed in [employer] record.     Review of Systems: Patient denies any hearing loss, fatigue, blurred vision, shortness of breath, abdominal pain, problems with bowel movements, urination, or intercourse. No joint pain or mood swings. Had headache the weekend, and pain side left breast and arm that comes and goes.Had negative COVID test 39/19/21.    Physical Exam:BP (!) 150/100 (BP Location: Right Arm, Cuff Size: Normal)   Pulse 93   Ht 4\' 11"  (1.499 m)   Wt 134 lb 3.2 oz (60.9 kg)   BMI 27.11 kg/m  General:  Well developed, well nourished, no acute distress Skin:  Warm and dry Neck:  Midline trachea, normal thyroid, good ROM, no lymphadenopathy Lungs; Clear to auscultation bilaterally Breast:  No dominant palpable mass, retraction, or nipple discharge Cardiovascular: Regular rate and rhythm Abdomen:  Soft, non tender, no hepatosplenomegaly Pelvic:  External genitalia is normal in appearance, no lesions.  The vagina is normal in appearance. Urethra has no lesions or masses. The cervix is smooth, pap with GC/CHL and high risk HPV 16/18 genotyping performed.  Uterus is felt to be normal size, shape, and contour.  No adnexal masses or tenderness noted.Bladder is non tender, no masses felt. Extremities/musculoskeletal:  No swelling or varicosities noted, no clubbing or cyanosis Psych:  No mood changes, alert and cooperative,seems happy Fall risk is low PHQ 2 score is  0. Examination chaperoned by LPN  Impression and Plan:  1. Encounter for gynecological examination with Papanicolaou smear of cervix Pap sent  Physical in 1 year Pap in 3 if normal Mammogram at 40  2. Elevated BP without diagnosis of hypertension Will Rx Norvasc and Microzide Meds ordered this encounter  Medications  . medroxyPROGESTERone (DEPO-PROVERA) 150 MG/ML injection    Sig: INJECT 1 ML INTO THE MUSCLE EVERY 11 TO 12 WEEKS    Dispense:  1 mL    Refill:  4    Order Specific Question:   Supervising Provider    Answer:   EURE, LUTHER H [[id number]]  . amLODipine (NORVASC) 5 MG tablet    Sig: Take 1 tablet (5 mg total) by mouth daily.    Dispense:  30 tablet    Refill:  3    Order Specific Question:   Supervising Provider    Answer:   Stoney Bang, LUTHER H [[id number]]  . hydrochlorothiazide (MICROZIDE) 12.5 MG capsule    Sig: Take 1 capsule (12.5 mg total) by mouth daily.    Dispense:  3 capsule    Refill:  3    Order Specific Question:   Supervising Provider    Answer:   Despina Hidden [[id number]]  Review handout on DASH diet, to limit salt and sugar Review handout on hypertension Check BP at work and keep log Check BP 2/9 when gets next depo Discussed with Dr 4/9 and he agrees with plan If feels sick, hot nausea and pain in chest or arm changes go to ER  and she agrees   3. Encounter for surveillance of injectable contraceptive Will refill depo Depo 05/26/19

## 2019-05-11 LAB — CYTOLOGY - PAP
Adequacy: ABSENT
Chlamydia: NEGATIVE
Comment: NEGATIVE
Comment: NEGATIVE
Comment: NORMAL
Diagnosis: NEGATIVE
High risk HPV: NEGATIVE
Neisseria Gonorrhea: NEGATIVE

## 2019-05-26 ENCOUNTER — Other Ambulatory Visit: Payer: Self-pay

## 2019-05-26 ENCOUNTER — Ambulatory Visit (INDEPENDENT_AMBULATORY_CARE_PROVIDER_SITE_OTHER): Payer: Managed Care, Other (non HMO) | Admitting: *Deleted

## 2019-05-26 ENCOUNTER — Encounter: Payer: Self-pay | Admitting: *Deleted

## 2019-05-26 VITALS — BP 150/105

## 2019-05-26 DIAGNOSIS — Z3042 Encounter for surveillance of injectable contraceptive: Secondary | ICD-10-CM | POA: Diagnosis not present

## 2019-05-26 MED ORDER — MEDROXYPROGESTERONE ACETATE 150 MG/ML IM SUSP
150.0000 mg | Freq: Once | INTRAMUSCULAR | Status: AC
  Administered 2019-05-26: 150 mg via INTRAMUSCULAR

## 2019-05-26 NOTE — Progress Notes (Signed)
   NURSE VISIT- INJECTION  SUBJECTIVE:  Heather Dodson is a 39 y.o. G88P1021 female here for a Depo Provera for contraception/period management. She is a GYN patient.   OBJECTIVE:  BP (!) 150/105 (BP Location: Left Arm, Patient Position: Sitting, Cuff Size: Normal)   Appears well, in no apparent distress  Injection administered in: Right deltoid  Meds ordered this encounter  Medications  . medroxyPROGESTERone (DEPO-PROVERA) injection 150 mg    ASSESSMENT: GYN patient Depo Provera for contraception/period management PLAN: Follow-up: in 11-13 weeks for next Depo    NURSE VISIT- BLOOD PRESSURE CHECK  SUBJECTIVE:  Heather Dodson is a 39 y.o. G34P1021 female here for BP check. She is a GYN patient. States she has not started her medication as she "doesn't like to take medication and wanted to adjust diet".  HYPERTENSION ROS:  GYN patient: . Taking medicines as instructed no . Headaches  Yes . Chest pain No . Shortness of breath No . Swelling in legs/ankles No  OBJECTIVE:  BP (!) 150/105 (BP Location: Left Arm, Patient Position: Sitting, Cuff Size: Normal)   Appearance alert, well appearing, and in no distress and oriented to person, place, and time.  ASSESSMENT: GYN  blood pressure check  PLAN: Discussed with [name], AGNP   Recommendations: start medication ASAP   Follow-up: in 2 weeks for BP check   Gaylia Nealie Mchatton  05/26/2019 3:45 PM   Debbe Odea Cayle Cordoba  05/26/2019 3:45 PM

## 2019-05-26 NOTE — Progress Notes (Signed)
Chart reviewed for nurse visit. Agree with plan of care.  Adline Potter, NP 05/26/2019 4:32 PM

## 2019-06-02 ENCOUNTER — Other Ambulatory Visit: Payer: Self-pay

## 2019-06-02 ENCOUNTER — Ambulatory Visit: Payer: Managed Care, Other (non HMO) | Attending: Internal Medicine

## 2019-06-02 DIAGNOSIS — Z20822 Contact with and (suspected) exposure to covid-19: Secondary | ICD-10-CM

## 2019-06-04 LAB — NOVEL CORONAVIRUS, NAA: SARS-CoV-2, NAA: NOT DETECTED

## 2019-06-16 ENCOUNTER — Other Ambulatory Visit: Payer: Self-pay | Admitting: Adult Health

## 2019-06-16 ENCOUNTER — Ambulatory Visit: Payer: Managed Care, Other (non HMO)

## 2019-06-16 ENCOUNTER — Other Ambulatory Visit: Payer: Self-pay

## 2019-06-16 VITALS — BP 137/95 | HR 90 | Ht 59.0 in | Wt 134.0 lb

## 2019-06-16 DIAGNOSIS — Z0130 Encounter for examination of blood pressure without abnormal findings: Secondary | ICD-10-CM

## 2019-06-16 MED ORDER — AMLODIPINE BESYLATE 5 MG PO TABS: 5.0000 mg | ORAL_TABLET | Freq: Every day | ORAL | 3 refills | Status: DC

## 2019-06-16 MED ORDER — HYDROCHLOROTHIAZIDE 12.5 MG PO CAPS: 12.5000 mg | ORAL_CAPSULE | Freq: Every day | ORAL | 3 refills | Status: DC

## 2019-06-16 NOTE — Addendum Note (Signed)
Addended by: [name] A on: 06/16/2019 09:06 AM   Modules accepted: Orders

## 2019-06-16 NOTE — Progress Notes (Signed)
   NURSE VISIT- BLOOD PRESSURE CHECK  SUBJECTIVE:  Heather Dodson is a 39 y.o. G48P1021 female here for BP check 137/95 Pulse 90. Taking amlodipine 5 mg, but not taking hydrochlorothiazide given only three table to take.Spoken jennifer griffin about blood pressure.  OBJECTIVE:   Appearance alert, well appearing, and in no distress. ASSESSMENT: GYN  blood pressure check  PLAN: Discussed with [name], AGNP   Recommendations: new prescription will be sent   Follow-up: 6 weeks for blood pressure check.will route note jennifer griffin  Rennis Petty  06/16/2019 8:36 AM

## 2019-06-16 NOTE — Progress Notes (Signed)
Chart reviewed for nurse visit. Agree with plan of care. Rx sent for microzide 12.5 mg take 1 daily #30 with 3 refills and refilled norvasc Adline Potter, NP 06/16/2019 9:04 AM

## 2019-06-16 NOTE — Progress Notes (Signed)
Refill microzide 

## 2019-06-25 ENCOUNTER — Ambulatory Visit: Payer: Managed Care, Other (non HMO) | Attending: Internal Medicine

## 2019-06-29 ENCOUNTER — Other Ambulatory Visit: Payer: Self-pay

## 2019-06-29 ENCOUNTER — Ambulatory Visit: Payer: Managed Care, Other (non HMO) | Attending: Internal Medicine

## 2019-06-29 DIAGNOSIS — Z20822 Contact with and (suspected) exposure to covid-19: Secondary | ICD-10-CM

## 2019-06-30 LAB — NOVEL CORONAVIRUS, NAA: SARS-CoV-2, NAA: NOT DETECTED

## 2019-07-03 ENCOUNTER — Telehealth: Payer: Self-pay | Admitting: *Deleted

## 2019-07-03 NOTE — Telephone Encounter (Signed)
Called for result from 3/17 test which has not resulted. Verbalized understanding.

## 2019-07-28 ENCOUNTER — Other Ambulatory Visit: Payer: Self-pay

## 2019-07-28 ENCOUNTER — Ambulatory Visit (INDEPENDENT_AMBULATORY_CARE_PROVIDER_SITE_OTHER): Payer: Managed Care, Other (non HMO)

## 2019-07-28 VITALS — BP 120/85 | HR 95 | Ht 59.0 in | Wt 132.4 lb

## 2019-07-28 DIAGNOSIS — Z0130 Encounter for examination of blood pressure without abnormal findings: Secondary | ICD-10-CM

## 2019-07-28 NOTE — Progress Notes (Signed)
Chart reviewed for nurse visit. Agree with plan of care. Keep taking BP meds and follow up in 3 months  Adline Potter, NP 07/28/2019 12:28 PM

## 2019-07-28 NOTE — Progress Notes (Signed)
   NURSE VISIT- BLOOD PRESSURE CHECK  SUBJECTIVE:  Heather Dodson is a 39 y.o. G96P1021 female here for BP check 120/85 pulse 95.Taking Amlodipine 5 mg and Microzide 12.5 mg.     OBJECTIVE:  BP 120/85 (BP Location: Right Arm, Patient Position: Sitting, Cuff Size: Normal)   Pulse 95   Ht 4\' 11"  (1.499 m)   Wt 132 lb 6.4 oz (60.1 kg)   BMI 26.74 kg/m   Appearance alert, well appearing, and in no distress.  ASSESSMENT: GYN  blood pressure check  PLAN: Discussed with , AGNP   Recommendations:    Follow-up 3 months with office visit with Heather Dodson. Will route note Heather Dodson    [name]  07/28/2019 8:41 AM

## 2019-08-03 ENCOUNTER — Other Ambulatory Visit: Payer: Self-pay | Admitting: Adult Health

## 2019-08-18 ENCOUNTER — Ambulatory Visit: Payer: Managed Care, Other (non HMO)

## 2019-08-21 ENCOUNTER — Other Ambulatory Visit: Payer: Self-pay

## 2019-08-21 ENCOUNTER — Ambulatory Visit (INDEPENDENT_AMBULATORY_CARE_PROVIDER_SITE_OTHER): Payer: Managed Care, Other (non HMO)

## 2019-08-21 VITALS — Ht 59.0 in | Wt 130.8 lb

## 2019-08-21 DIAGNOSIS — Z3042 Encounter for surveillance of injectable contraceptive: Secondary | ICD-10-CM

## 2019-08-21 MED ORDER — MEDROXYPROGESTERONE ACETATE 150 MG/ML IM SUSP
150.0000 mg | Freq: Once | INTRAMUSCULAR | Status: AC
  Administered 2019-08-21: 150 mg via INTRAMUSCULAR

## 2019-08-21 NOTE — Progress Notes (Signed)
   NURSE VISIT- INJECTION  SUBJECTIVE:  Heather Dodson is a 39 y.o. G38P1021 female here for a Depo Provera for contraception/period management. She is a GYN patient.   OBJECTIVE:  There were no vitals taken for this visit.  Appears well, in no apparent distress  Injection administered in: Right deltoid  Meds ordered this encounter  Medications  . medroxyPROGESTERone (DEPO-PROVERA) injection 150 mg    ASSESSMENT: GYN patient Depo Provera for contraception/period management PLAN: Follow-up: in 11-13 weeks for next Depo   Rennis Petty  08/21/2019 11:06 AM

## 2019-10-26 ENCOUNTER — Other Ambulatory Visit: Payer: Self-pay | Admitting: Adult Health

## 2019-10-26 ENCOUNTER — Ambulatory Visit: Payer: Managed Care, Other (non HMO) | Admitting: Adult Health

## 2019-10-27 ENCOUNTER — Encounter: Payer: Self-pay | Admitting: Adult Health

## 2019-10-27 ENCOUNTER — Ambulatory Visit: Payer: Managed Care, Other (non HMO) | Admitting: Adult Health

## 2019-10-27 VITALS — BP 118/81 | HR 94 | Ht 59.0 in | Wt 126.5 lb

## 2019-10-27 DIAGNOSIS — I10 Essential (primary) hypertension: Secondary | ICD-10-CM | POA: Diagnosis not present

## 2019-10-27 MED ORDER — HYDROCHLOROTHIAZIDE 12.5 MG PO CAPS: ORAL_CAPSULE | ORAL | 3 refills | Status: DC

## 2019-10-27 NOTE — Progress Notes (Signed)
  Subjective:     Patient ID: Heather Dodson, 39 y.o. female   DOB: 15-Sep-1980, 39 y.o.   MRN: [id number]  HPI Heather Dodson is a 39 year old black female, single, G3P1021 in for BP check, in on norvasc and microzide.   Review of Systems Has had increased uriantion with microzide Reviewed past medical,surgical, social and family history. Reviewed medications and allergies.     Objective:   Physical Exam BP 118/81 (BP Location: Left Arm, Patient Position: Sitting, Cuff Size: Normal)   Pulse 94   Ht 4\' 11"  (1.499 m)   Wt 126 lb 8 oz (57.4 kg)   BMI 25.55 kg/m  Skin warm and dry.  Lungs: clear to ausculation bilaterally. Cardiovascular: regular rate and rhythm. Fall risk is low  Upstream - 10/27/19 1018      Pregnancy Intention Screening   Does the patient want to become pregnant in the next year? No    Does the patient's partner want to become pregnant in the next year? No    Would the patient like to discuss contraceptive options today? No      Contraception Wrap Up   Current Method Hormonal Injection    End Method Hormonal Injection    Contraception Counseling Provided No         she is happy with dep    Assessment:     1. Essential hypertension BP is much better, will continue meds Meds ordered this encounter  Medications  . hydrochlorothiazide (MICROZIDE) 12.5 MG capsule    Sig: TAKE 1 CAPSULE BY MOUTH EVERY DAY    Dispense:  90 capsule    Refill:  3    Order Specific Question:   Supervising Provider    Answer:   10/29/19, LUTHER H [[id number]]     Refilled Norvasc earlier, 5 mg 1 daily #90 with 3 refills Plan:     Follow up in 3 months for ROS and BP check

## 2019-11-09 ENCOUNTER — Ambulatory Visit: Payer: Managed Care, Other (non HMO) | Admitting: Physician Assistant

## 2019-11-09 ENCOUNTER — Other Ambulatory Visit: Payer: Self-pay

## 2019-11-09 VITALS — BP 126/90 | HR 78 | Temp 98.2°F | Resp 16 | Ht 59.0 in | Wt 128.0 lb

## 2019-11-09 DIAGNOSIS — M545 Low back pain, unspecified: Secondary | ICD-10-CM

## 2019-11-09 NOTE — Progress Notes (Signed)
Subjective:    Patient ID: Heather Dodson, female    DOB: 1980-12-04, 39 y.o.   MRN: [id number]  HPI 39 yo petite ( 4'11") female presents with c/o low back pain On questioning the problem has been present over 2 weeks Specifically remembers moving boxes at home Bent at the waist without flexing knees tried to pick up  "a box that was too big for me and made my back hurt" Despite discomfort she made herself finish the job and pushed on.  Has continued usual home activities and gone to work in the interim weeks.  She now has intermittent discomfort particularly with positional changes or with prolonged sitting. Her car driver seat is uncomfortable in and out. 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 Hurts  when rises to walk, or t sit down  Denies any numbness or tingling, no shooting pain. No difficulty with Voiding or bowel function. Denies saddle symptoms. Discomfort is bilateral at the waistline with accentuation on the left with torque or twist Has taken nothing for the discomfort except on 2 occassions her dad gave her an Aleve at bedtime and she was able to sleep soundly. Does not have a PCP  Review of Systems Borderline HTN for a long time Sees GYN this Friday for Depo-provera    Objective:   Physical Exam Vitals and nursing note reviewed.  Constitutional:      Appearance: Normal appearance. She is normal weight.     Comments: Mild acute distress with positional changes Presents in flip flops Walks without hesitation On and off table without assistance Standing against wall as I enter Moved spontaneously to end of exam table Stepped up without hesitation and sat down. Expresses discomfort at waistline bilaterally with increased intensity on  left  HENT:     Head: Normocephalic and atraumatic.     Mouth/Throat:     Mouth: Mucous membranes are moist.  Eyes:     Extraocular Movements: Extraocular  movements intact.     Conjunctiva/sclera: Conjunctivae normal.  Cardiovascular:     Rate and Rhythm: Normal rate and regular rhythm.  Pulmonary:     Effort: Pulmonary effort is normal.     Breath sounds: Normal breath sounds.  Abdominal:     General: Abdomen is flat. There is no distension.     Palpations: Abdomen is soft.     Tenderness: There is no abdominal tenderness. There is no right CVA tenderness, left CVA tenderness or guarding.  Genitourinary:    Comments: defer Musculoskeletal:        General: Tenderness present. Normal range of motion.     Cervical back: Normal range of motion and neck supple.     Right lower leg: No edema.     Left lower leg: No edema.     Comments: Tenderness with direct palpation bilateral low back at waistline   No specific muscle spasm palpable,  left sciatic area reactive to compression No buttocks or lower leg involvement identified  Skin:    General: Skin is warm and dry.     Capillary Refill: Capillary refill takes less than 2 seconds.  Neurological:     General: No focal deficit present.     Mental Status: She is alert.     Cranial Nerves: No cranial nerve deficit.     Gait: Gait normal.     Deep Tendon Reflexes: Reflexes normal.     Comments: CNs and DTRs WNL  equal  No numbness or paresthesia identified   Psychiatric:        Mood and Affect: Mood normal.        Behavior: Behavior normal.       Assessment & Plan:   Low back pain with some left sided sciatica, local  Under-treated for pain, needs comfort interventions Have Toradol available in clinic but she is anxious about over reacting to medication and doesn't want a shot - has to go back to work   Rx: 1 Tylenol /acetominophen and 1 ibuprofen 200 mg  Po q 6h as needed for pain  Heating pad on low to chair at office- use in evening - prefer she NOT take it to bed but use on timer when awake.  Ambulate on the half hour for 5 minutes  Wear comfortable shoes with arch support and  foot stable for walking No heels, no flip flops- running sneakers encouraged  Hot shower or tub is fine if available and comfortable- precautions for slipping   Call this office tomorrow with a status report- Report to Urgent Care if any of the symptoms we discussed develop-  May need muscle relaxer but seemed to have gad signifcant relief with simple OTC Rx - will await feedback  Have explained that this clinic no longer serves PCP care  Given list of PCP offices in the area that are still accepting new patients For her review  She has an appointment with her GYN on Friday for Depo-   Encourage discussing Rx for muscle spasm /sleep if interim discomfort persists. AVS printed, reviewed and released to patient

## 2019-11-09 NOTE — Patient Instructions (Signed)
1 tylenol reg strength with 1 ibuprofen 200 mg-- repeat every 6 hours as needed  Heat pad to low back for comfort No prolonged sitting Walk 5 minutes every 30 minutes at office Support shoes with arch support-- no flip flops Good support mattress at night   If any numbness or tingling develops Any change in function Report for reevaluation at Urgent Care \ Priority to establish care with PCP        Child Pose    Sitting on knees, fold body over legs and relax head and arms on floor. Hold for ____ breaths.  http://yg.exer.us/127   Copyright  VHI. All rights reserved.  Sciatica  Sciatica is pain, weakness, tingling, or loss of feeling (numbness) along the sciatic nerve. The sciatic nerve starts in the lower back and goes down the back of each leg. Sciatica usually goes away on its own or with treatment. Sometimes, sciatica may come back (recur). What are the causes? This condition happens when the sciatic nerve is pinched or has pressure put on it. This may be the result of:  A disk in between the bones of the spine bulging out too far (herniated disk).  Changes in the spinal disks that occur with aging.  A condition that affects a muscle in the butt.  Extra bone growth near the sciatic nerve.  A break (fracture) of the area between your hip bones (pelvis).  Pregnancy.  Tumor. This is rare. What increases the risk? You are more likely to develop this condition if you:  Play sports that put pressure or stress on the spine.  Have poor strength and ease of movement (flexibility).  Have had a back injury in the past.  Have had back surgery.  Sit for long periods of time.  Do activities that involve bending or lifting over and over again.  Are very overweight (obese). What are the signs or symptoms? Symptoms can vary from mild to very bad. They may include:  Any of these problems in the lower back, leg, hip, or butt: ? Mild tingling, loss of  feeling, or dull aches. ? Burning sensations. ? Sharp pains.  Loss of feeling in the back of the calf or the sole of the foot.  Leg weakness.  Very bad back pain that makes it hard to move. These symptoms may get worse when you cough, sneeze, or laugh. They may also get worse when you sit or stand for long periods of time. How is this treated? This condition often gets better without any treatment. However, treatment may include:  Changing or cutting back on physical activity when you have pain.  Doing exercises and stretching.  Putting ice or heat on the affected area.  Medicines that help: ? To relieve pain and swelling. ? To relax your muscles.  Shots (injections) of medicines that help to relieve pain, irritation, and swelling.  Surgery. Follow these instructions at home: Medicines  Take over-the-counter and prescription medicines only as told by your doctor.  Ask your doctor if the medicine prescribed to you: ? Requires you to avoid driving or using heavy machinery. ? Can cause trouble pooping (constipation). You may need to take these steps to prevent or treat trouble pooping:  Drink enough fluids to keep your pee (urine) pale yellow.  Take over-the-counter or prescription medicines.  Eat foods that are high in fiber. These include beans, whole grains, and fresh fruits and vegetables.  Limit foods that are high in fat and sugar. These  include fried or sweet foods. Managing pain      If told, put ice on the affected area. ? Put ice in a plastic bag. ? Place a towel between your skin and the bag. ? Leave the ice on for 20 minutes, 2-3 times a day.  If told, put heat on the affected area. Use the heat source that your doctor tells you to use, such as a moist heat pack or a heating pad. ? Place a towel between your skin and the heat source. ? Leave the heat on for 20-30 minutes. ? Remove the heat if your skin turns bright red. This is very important if you are  unable to feel pain, heat, or cold. You may have a greater risk of getting burned. Activity   Return to your normal activities as told by your doctor. Ask your doctor what activities are safe for you.  Avoid activities that make your symptoms worse.  Take short rests during the day. ? When you rest for a long time, do some physical activity or stretching between periods of rest. ? Avoid sitting for a long time without moving. Get up and move around at least one time each hour.  Exercise and stretch regularly, as told by your doctor.  Do not lift anything that is heavier than 10 lb (4.5 kg) while you have symptoms of sciatica. ? Avoid lifting heavy things even when you do not have symptoms. ? Avoid lifting heavy things over and over.  When you lift objects, always lift in a way that is safe for your body. To do this, you should: ? Bend your knees. ? Keep the object close to your body. ? Avoid twisting. General instructions  Stay at a healthy weight.  Wear comfortable shoes that support your feet. Avoid wearing high heels.  Avoid sleeping on a mattress that is too soft or too hard. You might have less pain if you sleep on a mattress that is firm enough to support your back.  Keep all follow-up visits as told by your doctor. This is important. Contact a doctor if:  You have pain that: ? Wakes you up when you are sleeping. ? Gets worse when you lie down. ? Is worse than the pain you have had in the past. ? Lasts longer than 4 weeks.  You lose weight without trying. Get help right away if:  You cannot control when you pee (urinate) or poop (have a bowel movement).  You have weakness in any of these areas and it gets worse: ? Lower back. ? The area between your hip bones. ? Butt. ? Legs.  You have redness or swelling of your back.  You have a burning feeling when you pee. Summary  Sciatica is pain, weakness, tingling, or loss of feeling (numbness) along the sciatic  nerve.  This condition happens when the sciatic nerve is pinched or has pressure put on it.  Sciatica can cause pain, tingling, or loss of feeling (numbness) in the lower back, legs, hips, and butt.  Treatment often includes rest, exercise, medicines, and putting ice or heat on the affected area. This information is not intended to replace advice given to you by your health care provider. Make sure you discuss any questions you have with your health care provider. Document Revised: 04/21/2018 Document Reviewed: 04/21/2018 Elsevier Patient Education  2020 [employer].

## 2019-11-13 ENCOUNTER — Ambulatory Visit (INDEPENDENT_AMBULATORY_CARE_PROVIDER_SITE_OTHER): Payer: Managed Care, Other (non HMO) | Admitting: *Deleted

## 2019-11-13 DIAGNOSIS — Z3042 Encounter for surveillance of injectable contraceptive: Secondary | ICD-10-CM | POA: Diagnosis not present

## 2019-11-13 MED ORDER — MEDROXYPROGESTERONE ACETATE 150 MG/ML IM SUSP
150.0000 mg | Freq: Once | INTRAMUSCULAR | Status: AC
  Administered 2019-11-13: 150 mg via INTRAMUSCULAR

## 2019-11-13 NOTE — Progress Notes (Signed)
   NURSE VISIT- INJECTION  SUBJECTIVE:  Heather Dodson is a 39 y.o. G32P1021 female here for a Depo Provera for contraception/period management. She is a GYN patient.   OBJECTIVE:  There were no vitals taken for this visit.  Appears well, in no apparent distress  Injection administered in: Left deltoid  No orders of the defined types were placed in this encounter.   ASSESSMENT: GYN patient Depo Provera for contraception/period management PLAN: Follow-up: in 11-13 weeks for next Depo   Annamarie Dawley  11/13/2019 11:15 AM

## 2019-11-16 ENCOUNTER — Emergency Department (HOSPITAL_COMMUNITY): Payer: Managed Care, Other (non HMO)

## 2019-11-16 ENCOUNTER — Emergency Department (HOSPITAL_COMMUNITY)
Admission: EM | Admit: 2019-11-16 | Discharge: 2019-11-16 | Disposition: A | Discharge status: Home / Self Care | Payer: Managed Care, Other (non HMO) | Attending: Emergency Medicine | Admitting: Emergency Medicine

## 2019-11-16 ENCOUNTER — Encounter (HOSPITAL_COMMUNITY): Payer: Self-pay | Admitting: *Deleted

## 2019-11-16 ENCOUNTER — Other Ambulatory Visit: Payer: Self-pay

## 2019-11-16 DIAGNOSIS — M5432 Sciatica, left side: Secondary | ICD-10-CM | POA: Insufficient documentation

## 2019-11-16 DIAGNOSIS — Z79899 Other long term (current) drug therapy: Secondary | ICD-10-CM | POA: Diagnosis not present

## 2019-11-16 DIAGNOSIS — M545 Low back pain: Secondary | ICD-10-CM | POA: Diagnosis present

## 2019-11-16 DIAGNOSIS — I10 Essential (primary) hypertension: Secondary | ICD-10-CM | POA: Insufficient documentation

## 2019-11-16 LAB — URINALYSIS, ROUTINE W REFLEX MICROSCOPIC
Bacteria, UA: NONE SEEN
Bilirubin Urine: NEGATIVE
Glucose, UA: NEGATIVE mg/dL
Ketones, ur: NEGATIVE mg/dL
Leukocytes,Ua: NEGATIVE
Nitrite: NEGATIVE
Protein, ur: NEGATIVE mg/dL
Specific Gravity, Urine: 1.017 (ref 1.005–1.030)
pH: 5 (ref 5.0–8.0)

## 2019-11-16 LAB — POC URINE PREG, ED: Preg Test, Ur: NEGATIVE

## 2019-11-16 MED ORDER — CYCLOBENZAPRINE HCL 5 MG PO TABS
5.0000 mg | ORAL_TABLET | Freq: Three times a day (TID) | ORAL | 0 refills | Status: DC | PRN
Start: 2019-11-16 — End: 2020-07-25

## 2019-11-16 MED ORDER — HYDROCODONE-ACETAMINOPHEN 5-325 MG PO TABS: 1.0000 | ORAL_TABLET | ORAL | 0 refills | Status: DC | PRN

## 2019-11-16 MED ORDER — PREDNISONE 50 MG PO TABS
60.0000 mg | ORAL_TABLET | Freq: Once | ORAL | Status: AC
  Administered 2019-11-16: 60 mg via ORAL
  Filled 2019-11-16: qty 1

## 2019-11-16 MED ORDER — PREDNISONE 10 MG PO TABS
ORAL_TABLET | ORAL | 0 refills | Status: DC
Start: 2019-11-16 — End: 2020-07-25

## 2019-11-16 NOTE — ED Notes (Signed)
Patient states that she moved boxes about 2 weeks ago, taking Tylenol with some relief, states today, pain located left hip, denies radiation down leg, able to walk unassisted.

## 2019-11-16 NOTE — ED Triage Notes (Signed)
Denies urinary symptoms

## 2019-11-16 NOTE — ED Notes (Signed)
Patient transported to X-ray 

## 2019-11-16 NOTE — ED Triage Notes (Signed)
Low back pain for 2 weeks

## 2019-11-16 NOTE — Discharge Instructions (Addendum)
Take your next dose of prednisone tomorrow evening (in place of your ibuprofen).  Use the the other medicines as directed.  Do not drive within 4 hours of taking hydrocodone as this will make you drowsy.  Avoid lifting,  Bending,  Twisting or any other activity that worsens your pain over the next week.  Continue with application of heat.   You should get rechecked if your symptoms are not better over the next week,  or you develop increased pain, weakness in your leg(s) or loss of bladder or bowel function - these can be symptoms of a worsening condition.

## 2019-11-18 NOTE — ED Provider Notes (Signed)
[facility] EMERGENCY DEPARTMENT Provider Note   CSN: [id number] Arrival date & time: 11/16/19  1153     History Chief Complaint  Patient presents with  . Back Pain    Heather Dodson is a 39 y.o. female presenting with a 2-3 week history of bilateral low back which started while lifting heavy boxes at home.  She describes pain left>right with positional changes and worsening pain with prolonged sitting.  Denies radiation of pain into the extremities, no weakness or numbness, no urinary or fecal incontinence or retention.  Denies fevers or chills.  No IVDU.  She has seen her pcp and has been using tylenol and ibuprofen along with heat tx which has not relieved her symptoms.   HPI     Past Medical History:  Diagnosis Date  . BV (bacterial vaginosis) 06/22/2015  . 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 Contraceptive management 06/22/2015  . Vaginal odor 06/22/2015  . Vaginal Pap smear, abnormal     Patient Active Problem List   Diagnosis Date Noted  . Essential hypertension 10/27/2019  . Elevated BP without diagnosis of hypertension 05/07/2019  . Encounter for gynecological examination with Papanicolaou smear of cervix 05/07/2019  . Encounter for surveillance of injectable contraceptive 10/08/2017  . 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 Vaginal odor 06/22/2015  . BV (bacterial vaginosis) 06/22/2015  . Contraceptive management 06/22/2015    History reviewed. No pertinent surgical history.   OB History    Gravida  3   Para  1   Term  1   Preterm      AB  2   Living  1     SAB      TAB  2   Ectopic      Multiple      Live Births  1           Family History  Problem Relation Age of Onset  . Hypertension Mother   . Other Mother        had kidney removed due to kidney failure  . Cancer Mother 20       breast  . Hypertension Father   . Hyperlipidemia Father   . Diabetes Father   . Cancer Father        prostate  . Hypertension Sister   . Cancer Paternal Grandmother   . Cancer  Paternal Grandfather   . Hypertension Brother     Social History   Tobacco Use  . Smoking status: Never Smoker  . Smokeless tobacco: Never Used  Vaping Use  . Vaping Use: Never used  Substance Use Topics  . Alcohol use: Yes    Comment: occ  . Drug use: No    Home Medications Prior to Admission medications   Medication Sig Start Date End Date Taking? Authorizing Provider  amLODipine (NORVASC) 5 MG tablet TAKE 1 TABLET BY MOUTH EVERY DAY 10/27/19   Adline Potter, NP  cyclobenzaprine (FLEXERIL) 5 MG tablet Take 1 tablet (5 mg total) by mouth 3 (three) times daily as needed for muscle spasms. 11/16/19   Burgess Amor, PA-C  hydrochlorothiazide (MICROZIDE) 12.5 MG capsule TAKE 1 CAPSULE BY MOUTH EVERY DAY 10/27/19   Adline Potter, NP  HYDROcodone-acetaminophen (NORCO/VICODIN) 5-325 MG tablet Take 1 tablet by mouth every 4 (four) hours as needed. 11/16/19   Burgess Amor, PA-C  medroxyPROGESTERone (DEPO-PROVERA) 150 MG/ML injection INJECT 1 ML INTO THE MUSCLE EVERY 11 TO 12 WEEKS 05/07/19   Adline Potter, NP  predniSONE (DELTASONE)  10 MG tablet Take 6 tablets day one, 5 tablets day two, 4 tablets day three, 3 tablets day four, 2 tablets day five, then 1 tablet day six 11/16/19   Burgess Amor, PA-C    Allergies    Patient has no known allergies.  Review of Systems   Review of Systems  Constitutional: Negative for fever.  Respiratory: Negative for shortness of breath.   Cardiovascular: Negative for chest pain and leg swelling.  Gastrointestinal: Negative for abdominal distention, abdominal pain and constipation.  Genitourinary: Negative for difficulty urinating, dysuria, flank pain, frequency and urgency.  Musculoskeletal: Positive for back pain. Negative for gait problem and joint swelling.  Skin: Negative for rash.  Neurological: Negative for weakness and numbness.    Physical Exam Updated Vital Signs BP (!) 146/101 (BP Location: Right Arm)   Pulse 83   Temp 99.4 F (37.4  C) (Oral)   Resp 18   Ht 4\' 11"  (1.499 m)   Wt 57.6 kg   SpO2 100%   BMI 25.65 kg/m   Physical Exam Vitals and nursing note reviewed.  Constitutional:      Appearance: She is well-developed.  HENT:     Head: Normocephalic.  Eyes:     Conjunctiva/sclera: Conjunctivae normal.  Cardiovascular:     Rate and Rhythm: Normal rate.     Comments: Pedal pulses normal. Pulmonary:     Effort: Pulmonary effort is normal.  Abdominal:     General: Bowel sounds are normal. There is no distension.     Palpations: Abdomen is soft. There is no mass.  Musculoskeletal:     Cervical back: Normal range of motion and neck supple.     Lumbar back: Tenderness present. No swelling, edema, spasms or bony tenderness. Positive left straight leg raise test. Negative right straight leg raise test.     Comments: ttp over sciatic region left, radiation through buttock.  Skin:    General: Skin is warm and dry.  Neurological:     Mental Status: She is alert.     Sensory: No sensory deficit.     Motor: No tremor or atrophy.     Gait: Gait normal.     Deep Tendon Reflexes:     Reflex Scores:      Patellar reflexes are 2+ on the right side and 2+ on the left side.    Comments: No strength deficit noted in hip and knee flexor and extensor muscle groups.  Ankle flexion and extension intact.     ED Results / Procedures / Treatments   Labs (all labs ordered are listed, but only abnormal results are displayed) Labs Reviewed  URINALYSIS, ROUTINE W REFLEX MICROSCOPIC - Abnormal; Notable for the following components:      Result Value   Hgb urine dipstick MODERATE (*)    All other components within normal limits  POC URINE PREG, ED    EKG None  Radiology No results found.  Procedures Procedures (including critical care time)  Medications Ordered in ED Medications  predniSONE (DELTASONE) tablet 60 mg (60 mg Oral Given 11/16/19 1632)    ED Course  I have reviewed the triage vital signs and the  nursing notes.  Pertinent labs & imaging results that were available during my care of the patient were reviewed by me and considered in my medical decision making (see chart for details).    MDM Rules/Calculators/A&P  Pt with hx and exam suggesting new low back strain, now with early left sciatica. Urinalysis reviewed, she is having some some scant menstrual spotting.  Has no flank tenderness.  No neuro deficit on exam or by history to suggest emergent or surgical presentation.  Also discussed worsened sx that should prompt immediate re-evaluation including distal weakness, bowel/bladder retention/incontinence.       Final Clinical Impression(s) / ED Diagnoses Final diagnoses:  Sciatica of left side    Rx / DC Orders ED Discharge Orders         Ordered    cyclobenzaprine (FLEXERIL) 5 MG tablet  3 times daily PRN     Discontinue  Reprint     11/16/19 1609    predniSONE (DELTASONE) 10 MG tablet     Discontinue  Reprint     11/16/19 1609    HYDROcodone-acetaminophen (NORCO/VICODIN) 5-325 MG tablet  Every 4 hours PRN     Discontinue  Reprint     11/16/19 1609           Burgess Amor, PA-C 11/18/19 1957    Terrilee Files, MD 11/19/19 1017

## 2020-01-19 ENCOUNTER — Other Ambulatory Visit: Payer: Self-pay

## 2020-01-19 ENCOUNTER — Encounter: Payer: Self-pay | Admitting: Physician Assistant

## 2020-01-19 ENCOUNTER — Ambulatory Visit: Payer: Managed Care, Other (non HMO) | Admitting: Physician Assistant

## 2020-01-19 VITALS — BP 126/86 | HR 80 | Temp 98.0°F | Resp 16 | Ht 59.0 in | Wt 128.0 lb

## 2020-01-19 DIAGNOSIS — Z008 Encounter for other general examination: Secondary | ICD-10-CM | POA: Diagnosis not present

## 2020-01-19 DIAGNOSIS — I10 Essential (primary) hypertension: Secondary | ICD-10-CM

## 2020-01-19 DIAGNOSIS — Z0000 Encounter for general adult medical examination without abnormal findings: Secondary | ICD-10-CM | POA: Diagnosis not present

## 2020-01-19 NOTE — Progress Notes (Signed)
   Subjective:    Patient ID: Heather Dodson, female    DOB: [birth year], 39 y.o.   MRN: [id number]  HPI  39 yo AAF presents for Biometrics and brief exam Works with [employer]  Has HTN and Rx Norvasc and HCTZ- but also eats fast foods every day and is a " french fry junkie", drinks soda daily Admits to socks tracks and flip flops show marked indentations in feet. Mom and Dad both had HTN  Covid Vaccine 1 & 2 reported done  Concerned about pressure in her ears and decreased hearing. Uses Qtips once in a while "but sometime makes it worse"  Does not have a PCP...uses GYN for "most everything "  Review of Systems Does DDS q 6 months Denies seasonal allergies    Objective:   Physical Exam Vitals and nursing note reviewed.  Constitutional:      General: She is not in acute distress.    Appearance: Normal appearance. She is normal weight.  HENT:     Head: Normocephalic and atraumatic.     Right Ear: External ear normal. There is impacted cerumen.     Left Ear: External ear normal. There is impacted cerumen.     Ears:     Comments: TM non-viz bilaterally cerumen filled canals- No erythema no discomfort with manipulation    Nose: Nose normal.     Mouth/Throat:     Mouth: Mucous membranes are moist.  Eyes:     Extraocular Movements: Extraocular movements intact.     Conjunctiva/sclera: Conjunctivae normal.  Cardiovascular:     Rate and Rhythm: Normal rate and regular rhythm.     Pulses: Normal pulses.     Heart sounds: Normal heart sounds.  Pulmonary:     Effort: Pulmonary effort is normal.     Breath sounds: Normal breath sounds.  Abdominal:     General: Bowel sounds are normal.     Palpations: Abdomen is soft.  Genitourinary:    Comments: SBE reviewed, encouraged, fibrocystic-difficult for patient Sees GYN regularly - on Depo Provera- no menses  Musculoskeletal:        General: Swelling present. Normal range of motion.     Cervical back: Normal range  of motion and neck supple.     Comments: 2-3 + edema bilateral feet and lower leg  Skin:    General: Skin is warm.     Capillary Refill: Capillary refill takes less than 2 seconds.  Neurological:     General: No focal deficit present.     Mental Status: She is alert.  Psychiatric:        Mood and Affect: Mood normal.       Assessment & Plan:  DASH diet and Low Salt diet - Printed and given to patient Fresh and Frozen; baked or grilled not fried, no visible salt, Avoid processed foods Encourage establishing PCP- hand out given Encourage 30 minute brisk walk daily - good support shoes not flip flops- mildly elevated heart rate and respirations maintained. Rec self care with Ear Wax Removal kit-discussed-will need multiple therapies. will follow up if she doesn't feel relief , may schedule Questions fielded recommendations made Labs to be reported as available- she reports My Chart active

## 2020-01-19 NOTE — Patient Instructions (Signed)
Low-Sodium Eating Plan Sodium, which is an element that makes up salt, helps you maintain a healthy balance of fluids in your body. Too much sodium can increase your blood pressure and cause fluid and waste to be held in your body. Your health care provider or dietitian may recommend following this plan if you have high blood pressure (hypertension), kidney disease, liver disease, or heart failure. Eating less sodium can help lower your blood pressure, reduce swelling, and protect your heart, liver, and kidneys. What are tips for following this plan? General guidelines  Most people on this plan should limit their sodium intake to 1,500-2,000 mg (milligrams) of sodium each day. Reading food labels   The Nutrition Facts label lists the amount of sodium in one serving of the food. If you eat more than one serving, you must multiply the listed amount of sodium by the number of servings.  Choose foods with less than 140 mg of sodium per serving.  Avoid foods with 300 mg of sodium or more per serving. Shopping  Look for lower-sodium products, often labeled as "low-sodium" or "no salt added."  Always check the sodium content even if foods are labeled as "unsalted" or "no salt added".  Buy fresh foods. ? Avoid canned foods and premade or frozen meals. ? Avoid canned, cured, or processed meats  Buy breads that have less than 80 mg of sodium per slice. Cooking  Eat more home-cooked food and less restaurant, buffet, and fast food.  Avoid adding salt when cooking. Use salt-free seasonings or herbs instead of table salt or sea salt. Check with your health care provider or pharmacist before using salt substitutes.  Cook with plant-based oils, such as canola, sunflower, or olive oil. Meal planning  When eating at a restaurant, ask that your food be prepared with less salt or no salt, if possible.  Avoid foods that contain MSG (monosodium glutamate). MSG is sometimes added to Chinese food,  bouillon, and some canned foods. What foods are recommended? The items listed may not be a complete list. Talk with your dietitian about what dietary choices are best for you. Grains Low-sodium cereals, including oats, puffed wheat and rice, and shredded wheat. Low-sodium crackers. Unsalted rice. Unsalted pasta. Low-sodium bread. Whole-grain breads and whole-grain pasta. Vegetables Fresh or frozen vegetables. "No salt added" canned vegetables. "No salt added" tomato sauce and paste. Low-sodium or reduced-sodium tomato and vegetable juice. Fruits Fresh, frozen, or canned fruit. Fruit juice. Meats and other protein foods Fresh or frozen (no salt added) meat, poultry, seafood, and fish. Low-sodium canned tuna and salmon. Unsalted nuts. Dried peas, beans, and lentils without added salt. Unsalted canned beans. Eggs. Unsalted nut butters. Dairy Milk. Soy milk. Cheese that is naturally low in sodium, such as ricotta cheese, fresh mozzarella, or Swiss cheese Low-sodium or reduced-sodium cheese. Cream cheese. Yogurt. Fats and oils Unsalted butter. Unsalted margarine with no trans fat. Vegetable oils such as canola or olive oils. Seasonings and other foods Fresh and dried herbs and spices. Salt-free seasonings. Low-sodium mustard and ketchup. Sodium-free salad dressing. Sodium-free light mayonnaise. Fresh or refrigerated horseradish. Lemon juice. Vinegar. Homemade, reduced-sodium, or low-sodium soups. Unsalted popcorn and pretzels. Low-salt or salt-free chips. What foods are not recommended? The items listed may not be a complete list. Talk with your dietitian about what dietary choices are best for you. Grains Instant hot cereals. Bread stuffing, pancake, and biscuit mixes. Croutons. Seasoned rice or pasta mixes. Noodle soup cups. Boxed or frozen macaroni and cheese. Regular salted   crackers. Self-rising flour. Vegetables Sauerkraut, pickled vegetables, and relishes. Olives. French fries. Onion rings.  Regular canned vegetables (not low-sodium or reduced-sodium). Regular canned tomato sauce and paste (not low-sodium or reduced-sodium). Regular tomato and vegetable juice (not low-sodium or reduced-sodium). Frozen vegetables in sauces. Meats and other protein foods Meat or fish that is salted, canned, smoked, spiced, or pickled. Bacon, ham, sausage, hotdogs, corned beef, chipped beef, packaged lunch meats, salt pork, jerky, pickled herring, anchovies, regular canned tuna, sardines, salted nuts. Dairy Processed cheese and cheese spreads. Cheese curds. Blue cheese. Feta cheese. String cheese. Regular cottage cheese. Buttermilk. Canned milk. Fats and oils Salted butter. Regular margarine. Ghee. Bacon fat. Seasonings and other foods Onion salt, garlic salt, seasoned salt, table salt, and sea salt. Canned and packaged gravies. Worcestershire sauce. Tartar sauce. Barbecue sauce. Teriyaki sauce. Soy sauce, including reduced-sodium. Steak sauce. Fish sauce. Oyster sauce. Cocktail sauce. Horseradish that you find on the shelf. Regular ketchup and mustard. Meat flavorings and tenderizers. Bouillon cubes. Hot sauce and Tabasco sauce. Premade or packaged marinades. Premade or packaged taco seasonings. Relishes. Regular salad dressings. Salsa. Potato and tortilla chips. Corn chips and puffs. Salted popcorn and pretzels. Canned or dried soups. Pizza. Frozen entrees and pot pies. Summary  Eating less sodium can help lower your blood pressure, reduce swelling, and protect your heart, liver, and kidneys.  Most people on this plan should limit their sodium intake to 1,500-2,000 mg (milligrams) of sodium each day.  Canned, boxed, and frozen foods are high in sodium. Restaurant foods, fast foods, and pizza are also very high in sodium. You also get sodium by adding salt to food.  Try to cook at home, eat more fresh fruits and vegetables, and eat less fast food, canned, processed, or prepared foods. This information is  not intended to replace advice given to you by your health care provider. Make sure you discuss any questions you have with your health care provider. Document Revised: 03/15/2017 Document Reviewed: 03/26/2016 Elsevier Patient Education  2020 Elsevier Inc. DASH Eating Plan DASH stands for "Dietary Approaches to Stop Hypertension." The DASH eating plan is a healthy eating plan that has been shown to reduce high blood pressure (hypertension). It may also reduce your risk for type 2 diabetes, heart disease, and stroke. The DASH eating plan may also help with weight loss. What are tips for following this plan?  General guidelines  Avoid eating more than 2,300 mg (milligrams) of salt (sodium) a day. If you have hypertension, you may need to reduce your sodium intake to 1,500 mg a day.  Limit alcohol intake to no more than 1 drink a day for nonpregnant women and 2 drinks a day for men. One drink equals 12 oz of beer, 5 oz of wine, or 1 oz of hard liquor.  Work with your health care provider to maintain a healthy body weight or to lose weight. Ask what an ideal weight is for you.  Get at least 30 minutes of exercise that causes your heart to beat faster (aerobic exercise) most days of the week. Activities may include walking, swimming, or biking.  Work with your health care provider or diet and nutrition specialist (dietitian) to adjust your eating plan to your individual calorie needs. Reading food labels   Check food labels for the amount of sodium per serving. Choose foods with less than 5 percent of the Daily Value of sodium. Generally, foods with less than 300 mg of sodium per serving fit into this eating plan.    To find whole grains, look for the word "whole" as the first word in the ingredient list. Shopping  Buy products labeled as "low-sodium" or "no salt added."  Buy fresh foods. Avoid canned foods and premade or frozen meals. Cooking  Avoid adding salt when cooking. Use salt-free  seasonings or herbs instead of table salt or sea salt. Check with your health care provider or pharmacist before using salt substitutes.  Do not fry foods. Cook foods using healthy methods such as baking, boiling, grilling, and broiling instead.  Cook with heart-healthy oils, such as olive, canola, soybean, or sunflower oil. Meal planning  Eat a balanced diet that includes: ? 5 or more servings of fruits and vegetables each day. At each meal, try to fill half of your plate with fruits and vegetables. ? Up to 6-8 servings of whole grains each day. ? Less than 6 oz of lean meat, poultry, or fish each day. A 3-oz serving of meat is about the same size as a deck of cards. One egg equals 1 oz. ? 2 servings of low-fat dairy each day. ? A serving of nuts, seeds, or beans 5 times each week. ? Heart-healthy fats. Healthy fats called Omega-3 fatty acids are found in foods such as flaxseeds and coldwater fish, like sardines, salmon, and mackerel.  Limit how much you eat of the following: ? Canned or prepackaged foods. ? Food that is high in trans fat, such as fried foods. ? Food that is high in saturated fat, such as fatty meat. ? Sweets, desserts, sugary drinks, and other foods with added sugar. ? Full-fat dairy products.  Do not salt foods before eating.  Try to eat at least 2 vegetarian meals each week.  Eat more home-cooked food and less restaurant, buffet, and fast food.  When eating at a restaurant, ask that your food be prepared with less salt or no salt, if possible. What foods are recommended? The items listed may not be a complete list. Talk with your dietitian about what dietary choices are best for you. Grains Whole-grain or whole-wheat bread. Whole-grain or whole-wheat pasta. Brown rice. Oatmeal. Quinoa. Bulgur. Whole-grain and low-sodium cereals. Pita bread. Low-fat, low-sodium crackers. Whole-wheat flour tortillas. Vegetables Fresh or frozen vegetables (raw, steamed, roasted, or  grilled). Low-sodium or reduced-sodium tomato and vegetable juice. Low-sodium or reduced-sodium tomato sauce and tomato paste. Low-sodium or reduced-sodium canned vegetables. Fruits All fresh, dried, or frozen fruit. Canned fruit in natural juice (without added sugar). Meat and other protein foods Skinless chicken or turkey. Ground chicken or turkey. Pork with fat trimmed off. Fish and seafood. Egg whites. Dried beans, peas, or lentils. Unsalted nuts, nut butters, and seeds. Unsalted canned beans. Lean cuts of beef with fat trimmed off. Low-sodium, lean deli meat. Dairy Low-fat (1%) or fat-free (skim) milk. Fat-free, low-fat, or reduced-fat cheeses. Nonfat, low-sodium ricotta or cottage cheese. Low-fat or nonfat yogurt. Low-fat, low-sodium cheese. Fats and oils Soft margarine without trans fats. Vegetable oil. Low-fat, reduced-fat, or light mayonnaise and salad dressings (reduced-sodium). Canola, safflower, olive, soybean, and sunflower oils. Avocado. Seasoning and other foods Herbs. Spices. Seasoning mixes without salt. Unsalted popcorn and pretzels. Fat-free sweets. What foods are not recommended? The items listed may not be a complete list. Talk with your dietitian about what dietary choices are best for you. Grains Baked goods made with fat, such as croissants, muffins, or some breads. Dry pasta or rice meal packs. Vegetables Creamed or fried vegetables. Vegetables in a cheese sauce. Regular canned vegetables (not   low-sodium or reduced-sodium). Regular canned tomato sauce and paste (not low-sodium or reduced-sodium). Regular tomato and vegetable juice (not low-sodium or reduced-sodium). Pickles. Olives. Fruits Canned fruit in a light or heavy syrup. Fried fruit. Fruit in cream or butter sauce. Meat and other protein foods Fatty cuts of meat. Ribs. Fried meat. Bacon. Sausage. Bologna and other processed lunch meats. Salami. Fatback. Hotdogs. Bratwurst. Salted nuts and seeds. Canned beans with  added salt. Canned or smoked fish. Whole eggs or egg yolks. Chicken or turkey with skin. Dairy Whole or 2% milk, cream, and half-and-half. Whole or full-fat cream cheese. Whole-fat or sweetened yogurt. Full-fat cheese. Nondairy creamers. Whipped toppings. Processed cheese and cheese spreads. Fats and oils Butter. Stick margarine. Lard. Shortening. Ghee. Bacon fat. Tropical oils, such as coconut, palm kernel, or palm oil. Seasoning and other foods Salted popcorn and pretzels. Onion salt, garlic salt, seasoned salt, table salt, and sea salt. Worcestershire sauce. Tartar sauce. Barbecue sauce. Teriyaki sauce. Soy sauce, including reduced-sodium. Steak sauce. Canned and packaged gravies. Fish sauce. Oyster sauce. Cocktail sauce. Horseradish that you find on the shelf. Ketchup. Mustard. Meat flavorings and tenderizers. Bouillon cubes. Hot sauce and Tabasco sauce. Premade or packaged marinades. Premade or packaged taco seasonings. Relishes. Regular salad dressings. Where to find more information:  National Heart, Lung, and Blood Institute: www.nhlbi.nih.gov  American Heart Association: www.heart.org Summary  The DASH eating plan is a healthy eating plan that has been shown to reduce high blood pressure (hypertension). It may also reduce your risk for type 2 diabetes, heart disease, and stroke.  With the DASH eating plan, you should limit salt (sodium) intake to 2,300 mg a day. If you have hypertension, you may need to reduce your sodium intake to 1,500 mg a day.  When on the DASH eating plan, aim to eat more fresh fruits and vegetables, whole grains, lean proteins, low-fat dairy, and heart-healthy fats.  Work with your health care provider or diet and nutrition specialist (dietitian) to adjust your eating plan to your individual calorie needs. This information is not intended to replace advice given to you by your health care provider. Make sure you discuss any questions you have with your health care  provider. Document Revised: 03/15/2017 Document Reviewed: 03/26/2016 Elsevier Patient Education  2020 Elsevier Inc.  

## 2020-01-20 LAB — GLUCOSE, RANDOM: Glucose: 87 mg/dL (ref 65–99)

## 2020-01-20 LAB — LIPID PANEL
Chol/HDL Ratio: 2.7 ratio (ref 0.0–4.4)
Cholesterol, Total: 152 mg/dL (ref 100–199)
HDL: 56 mg/dL (ref >39)
LDL Chol Calc (NIH): 88 mg/dL (ref 0–99)
Triglycerides: 31 mg/dL (ref 0–149)
VLDL Cholesterol Cal: 8 mg/dL (ref 5–40)

## 2020-01-27 ENCOUNTER — Ambulatory Visit: Payer: Managed Care, Other (non HMO) | Admitting: Adult Health

## 2020-02-05 ENCOUNTER — Ambulatory Visit (INDEPENDENT_AMBULATORY_CARE_PROVIDER_SITE_OTHER): Payer: Managed Care, Other (non HMO) | Admitting: *Deleted

## 2020-02-05 ENCOUNTER — Other Ambulatory Visit: Payer: Self-pay

## 2020-02-05 DIAGNOSIS — Z3042 Encounter for surveillance of injectable contraceptive: Secondary | ICD-10-CM

## 2020-02-05 MED ORDER — MEDROXYPROGESTERONE ACETATE 150 MG/ML IM SUSP
150.0000 mg | Freq: Once | INTRAMUSCULAR | Status: AC
  Administered 2020-02-05: 150 mg via INTRAMUSCULAR

## 2020-02-05 NOTE — Progress Notes (Signed)
   NURSE VISIT- INJECTION  SUBJECTIVE:  Heather Dodson is a 39 y.o. G19P1021 female here for a Depo Provera for contraception/period management. She is a GYN patient.   OBJECTIVE:  There were no vitals taken for this visit.  Appears well, in no apparent distress  Injection administered in: Right deltoid  Meds ordered this encounter  Medications  . medroxyPROGESTERone (DEPO-PROVERA) injection 150 mg    ASSESSMENT: GYN patient Depo Provera for contraception/period management PLAN: Follow-up: in 11-13 weeks for next Depo   Annamarie Dawley  02/05/2020 9:25 AM

## 2020-02-15 ENCOUNTER — Ambulatory Visit: Payer: Managed Care, Other (non HMO) | Admitting: Adult Health

## 2020-02-29 ENCOUNTER — Ambulatory Visit: Payer: Managed Care, Other (non HMO) | Admitting: Adult Health

## 2020-04-25 ENCOUNTER — Ambulatory Visit: Payer: Managed Care, Other (non HMO)

## 2020-04-26 ENCOUNTER — Ambulatory Visit (INDEPENDENT_AMBULATORY_CARE_PROVIDER_SITE_OTHER): Payer: Managed Care, Other (non HMO)

## 2020-04-26 ENCOUNTER — Other Ambulatory Visit: Payer: Self-pay

## 2020-04-26 DIAGNOSIS — Z3042 Encounter for surveillance of injectable contraceptive: Secondary | ICD-10-CM | POA: Diagnosis not present

## 2020-04-26 MED ORDER — MEDROXYPROGESTERONE ACETATE 150 MG/ML IM SUSP
150.0000 mg | Freq: Once | INTRAMUSCULAR | Status: AC
  Administered 2020-04-26: 150 mg via INTRAMUSCULAR

## 2020-04-26 NOTE — Progress Notes (Signed)
   NURSE VISIT- INJECTION  SUBJECTIVE:  Heather Dodson is a 40 y.o. G47P1021 female here for a Depo Provera for contraception/period management. She is a GYN patient.   OBJECTIVE:  There were no vitals taken for this visit.  Appears well, in no apparent distress  Injection administered in: Left deltoid  Meds ordered this encounter  Medications  . medroxyPROGESTERone (DEPO-PROVERA) injection 150 mg    ASSESSMENT: GYN patient Depo Provera for contraception/period management PLAN: Follow-up: in 11-13 weeks for next Depo   Heather Dodson  04/26/2020 11:55 AM

## 2020-05-05 ENCOUNTER — Other Ambulatory Visit: Payer: Managed Care, Other (non HMO)

## 2020-05-05 DIAGNOSIS — Z20822 Contact with and (suspected) exposure to covid-19: Secondary | ICD-10-CM

## 2020-05-07 LAB — NOVEL CORONAVIRUS, NAA: SARS-CoV-2, NAA: DETECTED — AB

## 2020-05-07 LAB — SARS-COV-2, NAA 2 DAY TAT

## 2020-07-05 ENCOUNTER — Telehealth: Payer: Self-pay | Admitting: Obstetrics & Gynecology

## 2020-07-05 NOTE — Telephone Encounter (Signed)
Called pharmacy to check on medicine recall. [facility] stated that they didn't have any information yet. There are probably 10-12 different manufacturers and only 1 of them would have the recall. When they get more info and find out if she is affected, they will let her know.  Called pt to inform her of what the pharmacy told me. Reassured her that they would reach out to her if she should be affected and how to proceed at that time. Pt confirmed understanding.

## 2020-07-05 NOTE — Telephone Encounter (Signed)
PT says she saw on the news this morning that a medication she was prescribed was recalled and would like to know if it was her exact meds and what to do if so. The medication is hydrochlorothiazide.

## 2020-07-12 ENCOUNTER — Ambulatory Visit: Payer: Managed Care, Other (non HMO)

## 2020-07-21 ENCOUNTER — Other Ambulatory Visit: Payer: Self-pay | Admitting: Adult Health

## 2020-07-25 ENCOUNTER — Ambulatory Visit (INDEPENDENT_AMBULATORY_CARE_PROVIDER_SITE_OTHER): Payer: Managed Care, Other (non HMO) | Admitting: *Deleted

## 2020-07-25 ENCOUNTER — Other Ambulatory Visit: Payer: Self-pay

## 2020-07-25 DIAGNOSIS — Z308 Encounter for other contraceptive management: Secondary | ICD-10-CM | POA: Diagnosis not present

## 2020-07-25 MED ORDER — MEDROXYPROGESTERONE ACETATE 150 MG/ML IM SUSP
150.0000 mg | Freq: Once | INTRAMUSCULAR | Status: AC
  Administered 2020-07-25: 150 mg via INTRAMUSCULAR

## 2020-07-25 NOTE — Progress Notes (Signed)
   NURSE VISIT- INJECTION  SUBJECTIVE:  Heather Dodson is a 40 y.o. G14P1021 female here for a Depo Provera for contraception/period management. She is a GYN patient.   OBJECTIVE:  There were no vitals taken for this visit.  Appears well, in no apparent distress  Injection administered in: Right deltoid  Meds ordered this encounter  Medications  . medroxyPROGESTERone (DEPO-PROVERA) injection 150 mg    ASSESSMENT: GYN patient Depo Provera for contraception/period management PLAN: Follow-up: in 11-13 weeks for next Depo   Malachy Mood  07/25/2020 11:29 AM

## 2020-10-14 ENCOUNTER — Ambulatory Visit: Payer: Managed Care, Other (non HMO)

## 2020-10-18 ENCOUNTER — Other Ambulatory Visit: Payer: Self-pay | Admitting: Adult Health

## 2020-10-24 ENCOUNTER — Other Ambulatory Visit: Payer: Self-pay

## 2020-10-24 ENCOUNTER — Ambulatory Visit (INDEPENDENT_AMBULATORY_CARE_PROVIDER_SITE_OTHER): Payer: Managed Care, Other (non HMO) | Admitting: *Deleted

## 2020-10-24 DIAGNOSIS — Z308 Encounter for other contraceptive management: Secondary | ICD-10-CM | POA: Diagnosis not present

## 2020-10-24 MED ORDER — MEDROXYPROGESTERONE ACETATE 150 MG/ML IM SUSP
150.0000 mg | Freq: Once | INTRAMUSCULAR | Status: AC
  Administered 2020-10-24: 150 mg via INTRAMUSCULAR

## 2020-10-24 NOTE — Progress Notes (Signed)
   NURSE VISIT- INJECTION  SUBJECTIVE:  Heather Dodson is a 40 y.o. G9P1021 female here for a Depo Provera for contraception/period management. She is a GYN patient.   OBJECTIVE:  There were no vitals taken for this visit.  Appears well, in no apparent distress  Injection administered in: Left deltoid  Meds ordered this encounter  Medications   medroxyPROGESTERone (DEPO-PROVERA) injection 150 mg    ASSESSMENT: GYN patient Depo Provera for contraception/period management PLAN: Follow-up: in 11-13 weeks for next Depo   Malachy Mood  10/24/2020 3:56 PM

## 2020-12-05 ENCOUNTER — Other Ambulatory Visit: Payer: Self-pay | Admitting: Adult Health

## 2021-01-16 ENCOUNTER — Ambulatory Visit (INDEPENDENT_AMBULATORY_CARE_PROVIDER_SITE_OTHER): Payer: Managed Care, Other (non HMO) | Admitting: *Deleted

## 2021-01-16 ENCOUNTER — Other Ambulatory Visit: Payer: Self-pay

## 2021-01-16 DIAGNOSIS — Z308 Encounter for other contraceptive management: Secondary | ICD-10-CM

## 2021-01-16 MED ORDER — MEDROXYPROGESTERONE ACETATE 150 MG/ML IM SUSP
150.0000 mg | Freq: Once | INTRAMUSCULAR | Status: AC
  Administered 2021-01-16: 150 mg via INTRAMUSCULAR

## 2021-01-16 NOTE — Progress Notes (Signed)
   NURSE VISIT- INJECTION  SUBJECTIVE:  Heather Dodson is a 40 y.o. G51P1021 female here for a Depo Provera for contraception/period management. She is a GYN patient.   OBJECTIVE:  There were no vitals taken for this visit.  Appears well, in no apparent distress  Injection administered in: Right deltoid  Meds ordered this encounter  Medications   medroxyPROGESTERone (DEPO-PROVERA) injection 150 mg    ASSESSMENT: GYN patient Depo Provera for contraception/period management PLAN: Follow-up: in 11-13 weeks for next Depo   Malachy Mood  01/16/2021 3:16 PM

## 2021-01-17 ENCOUNTER — Ambulatory Visit: Payer: Managed Care, Other (non HMO)

## 2021-03-06 ENCOUNTER — Other Ambulatory Visit: Payer: Self-pay | Admitting: Adult Health

## 2021-04-12 ENCOUNTER — Encounter: Payer: Self-pay | Admitting: Adult Health

## 2021-04-12 ENCOUNTER — Other Ambulatory Visit: Payer: Self-pay

## 2021-04-12 ENCOUNTER — Ambulatory Visit (INDEPENDENT_AMBULATORY_CARE_PROVIDER_SITE_OTHER): Payer: Managed Care, Other (non HMO) | Admitting: Adult Health

## 2021-04-12 VITALS — BP 133/86 | HR 89 | Ht 59.0 in | Wt 140.0 lb

## 2021-04-12 DIAGNOSIS — Z1211 Encounter for screening for malignant neoplasm of colon: Secondary | ICD-10-CM | POA: Diagnosis not present

## 2021-04-12 DIAGNOSIS — Z01419 Encounter for gynecological examination (general) (routine) without abnormal findings: Secondary | ICD-10-CM | POA: Insufficient documentation

## 2021-04-12 DIAGNOSIS — Z3042 Encounter for surveillance of injectable contraceptive: Secondary | ICD-10-CM

## 2021-04-12 DIAGNOSIS — I10 Essential (primary) hypertension: Secondary | ICD-10-CM | POA: Diagnosis not present

## 2021-04-12 LAB — HEMOCCULT GUIAC POC 1CARD (OFFICE): Fecal Occult Blood, POC: NEGATIVE

## 2021-04-12 MED ORDER — MEDROXYPROGESTERONE ACETATE 150 MG/ML IM SUSP
150.0000 mg | Freq: Once | INTRAMUSCULAR | Status: AC
  Administered 2021-04-12: 16:00:00 150 mg via INTRAMUSCULAR

## 2021-04-12 NOTE — Progress Notes (Signed)
Depo Provera 150 mg given IM in left deltoid. Patient tolerated well, next dose in 11-13 weeks.

## 2021-04-12 NOTE — Progress Notes (Signed)
Patient ID: Heather Dodson, female   DOB: 1980-06-15, 40 y.o.   MRN: [id number] History of Present Illness: Heather Dodson is a 40 year old black female,single, G3P1021 in for a well woman gyn exam and depo.  Lab Results  Component Value Date   DIAGPAP  05/07/2019    - Negative for intraepithelial lesion or malignancy (NILM)   HPVHIGH Negative 05/07/2019    Current Medications, Allergies, Past Medical History, Past Surgical History, Family History and Social History were reviewed in [employer] record.     Review of Systems:  Patient denies any headaches, hearing loss, fatigue, blurred vision, shortness of breath, chest pain, abdominal pain, problems with bowel movements, urination, or intercourse. No joint pain or mood swings.  Decreased sex drive lately.  Physical Exam:BP 133/86 (BP Location: Right Arm, Patient Position: Sitting, Cuff Size: Normal)    Pulse 89    Ht 4\' 11"  (1.499 m)    Wt 140 lb (63.5 kg)    BMI 28.28 kg/m   General:  Well developed, well nourished, no acute distress Skin:  Warm and dry Neck:  Midline trachea, normal thyroid, good ROM, no lymphadenopathy Lungs; Clear to auscultation bilaterally Breast:  No dominant palpable mass, retraction, or nipple discharge Cardiovascular: Regular rate and rhythm Abdomen:  Soft, non tender, no hepatosplenomegaly Pelvic:  External genitalia is normal in appearance, no lesions.  The vagina is normal in appearance. Urethra has no lesions or masses. The cervix is bulbous.  Uterus is felt to be normal size, shape, and contour.  No adnexal masses or tenderness noted.Bladder is non tender, no masses felt. Rectal: Good sphincter tone, no polyps, or hemorrhoids felt.  Hemoccult negative. Extremities/musculoskeletal:  No swelling or varicosities noted, no clubbing or cyanosis Psych:  No mood changes, alert and cooperative,seems happy AA is 2 Fall risk is low Depression screen [facility] 2/9 04/12/2021 05/07/2019 10/08/2017   Decreased Interest 0 0 0  Down, Depressed, Hopeless 0 0 0  PHQ - 2 Score 0 0 0  Altered sleeping 0 - -  Tired, decreased energy 0 - -  Change in appetite 0 - -  Feeling bad or failure about yourself  0 - -  Trouble concentrating 0 - -  Moving slowly or fidgety/restless 0 - -  Suicidal thoughts 0 - -  PHQ-9 Score 0 - -    GAD 7 : Generalized Anxiety Score 04/12/2021  Nervous, Anxious, on Edge 0  Control/stop worrying 0  Worry too much - different things 0  Trouble relaxing 0  Restless 0  Easily annoyed or irritable 0  Afraid - awful might happen 0  Total GAD 7 Score 0    Upstream - 04/12/21 1534       Pregnancy Intention Screening   Does the patient want to become pregnant in the next year? No    Does the patient's partner want to become pregnant in the next year? No    Would the patient like to discuss contraceptive options today? No      Contraception Wrap Up   Current Method Hormonal Injection    End Method Hormonal Injection    Contraception Counseling Provided No            She got depo today.   Examination chaperoned by 04/14/21 LPN   Impression and Plan: 1. Depo-Provera contraceptive status   2. Essential hypertension Continue Microzide and Norvasc, has refills   3. Encounter for well woman exam with routine gynecological exam Physical in  1 year Pap in 2024 Labs with PCP  4. Encounter for screening fecal occult blood testing   5. Encounter for surveillance of injectable contraceptive Continue depo, has refills  Return in 12 weeks for depo

## 2021-06-09 ENCOUNTER — Other Ambulatory Visit: Payer: Self-pay | Admitting: Adult Health

## 2021-07-05 ENCOUNTER — Ambulatory Visit: Payer: Managed Care, Other (non HMO)

## 2021-07-12 ENCOUNTER — Ambulatory Visit (INDEPENDENT_AMBULATORY_CARE_PROVIDER_SITE_OTHER): Payer: Managed Care, Other (non HMO) | Admitting: *Deleted

## 2021-07-12 ENCOUNTER — Other Ambulatory Visit: Payer: Self-pay

## 2021-07-12 DIAGNOSIS — Z3042 Encounter for surveillance of injectable contraceptive: Secondary | ICD-10-CM

## 2021-07-12 MED ORDER — MEDROXYPROGESTERONE ACETATE 150 MG/ML IM SUSP
150.0000 mg | Freq: Once | INTRAMUSCULAR | Status: AC
  Administered 2021-07-12: 150 mg via INTRAMUSCULAR

## 2021-07-12 NOTE — Progress Notes (Signed)
? ?  NURSE VISIT- INJECTION ? ?SUBJECTIVE:  ?Heather Dodson is a 41 y.o. G71P1021 female here for a Depo Provera for contraception/period management. She is a GYN patient.  ? ?OBJECTIVE:  ?There were no vitals taken for this visit.  ?Appears well, in no apparent distress ? ?Injection administered in: Left deltoid ? ?Meds ordered this encounter  ?Medications  ? medroxyPROGESTERone (DEPO-PROVERA) injection 150 mg  ? ? ?ASSESSMENT: ?GYN patient Depo Provera for contraception/period management ?PLAN: ?Follow-up: in 11-13 weeks for next Depo; please send in refills on Depo.   ? ?Malachy Mood  ?07/12/2021 ?9:43 AM ? ?

## 2021-09-24 ENCOUNTER — Other Ambulatory Visit: Payer: Self-pay | Admitting: Adult Health

## 2021-10-02 ENCOUNTER — Ambulatory Visit (INDEPENDENT_AMBULATORY_CARE_PROVIDER_SITE_OTHER): Payer: Managed Care, Other (non HMO) | Admitting: *Deleted

## 2021-10-02 DIAGNOSIS — Z3042 Encounter for surveillance of injectable contraceptive: Secondary | ICD-10-CM

## 2021-10-02 MED ORDER — MEDROXYPROGESTERONE ACETATE 150 MG/ML IM SUSP
150.0000 mg | Freq: Once | INTRAMUSCULAR | Status: AC
  Administered 2021-10-02: 150 mg via INTRAMUSCULAR

## 2021-10-02 NOTE — Progress Notes (Signed)
   NURSE VISIT- INJECTION  SUBJECTIVE:  Heather Dodson is a 41 y.o. G23P1021 female here for a Depo Provera for contraception/period management. She is a GYN patient.   OBJECTIVE:  There were no vitals taken for this visit.  Appears well, in no apparent distress  Injection administered in: Right deltoid  Meds ordered this encounter  Medications   medroxyPROGESTERone (DEPO-PROVERA) injection 150 mg    ASSESSMENT: GYN patient Depo Provera for contraception/period management PLAN: Follow-up: in 11-13 weeks for next Depo   Grady Lucci  10/02/2021 3:38 PM

## 2021-10-19 IMAGING — DX DG HIP (WITH OR WITHOUT PELVIS) 2-3V*L*
3 series · 3 of 3 positions shown · non-contrast
Comparison: None.

CLINICAL DATA: Low back pain radiating into the left hip since the
patient moved boxes approximately 2 weeks ago.

EXAM:
DG HIP (WITH OR WITHOUT PELVIS) 2-3V LEFT

[pelvis ap]
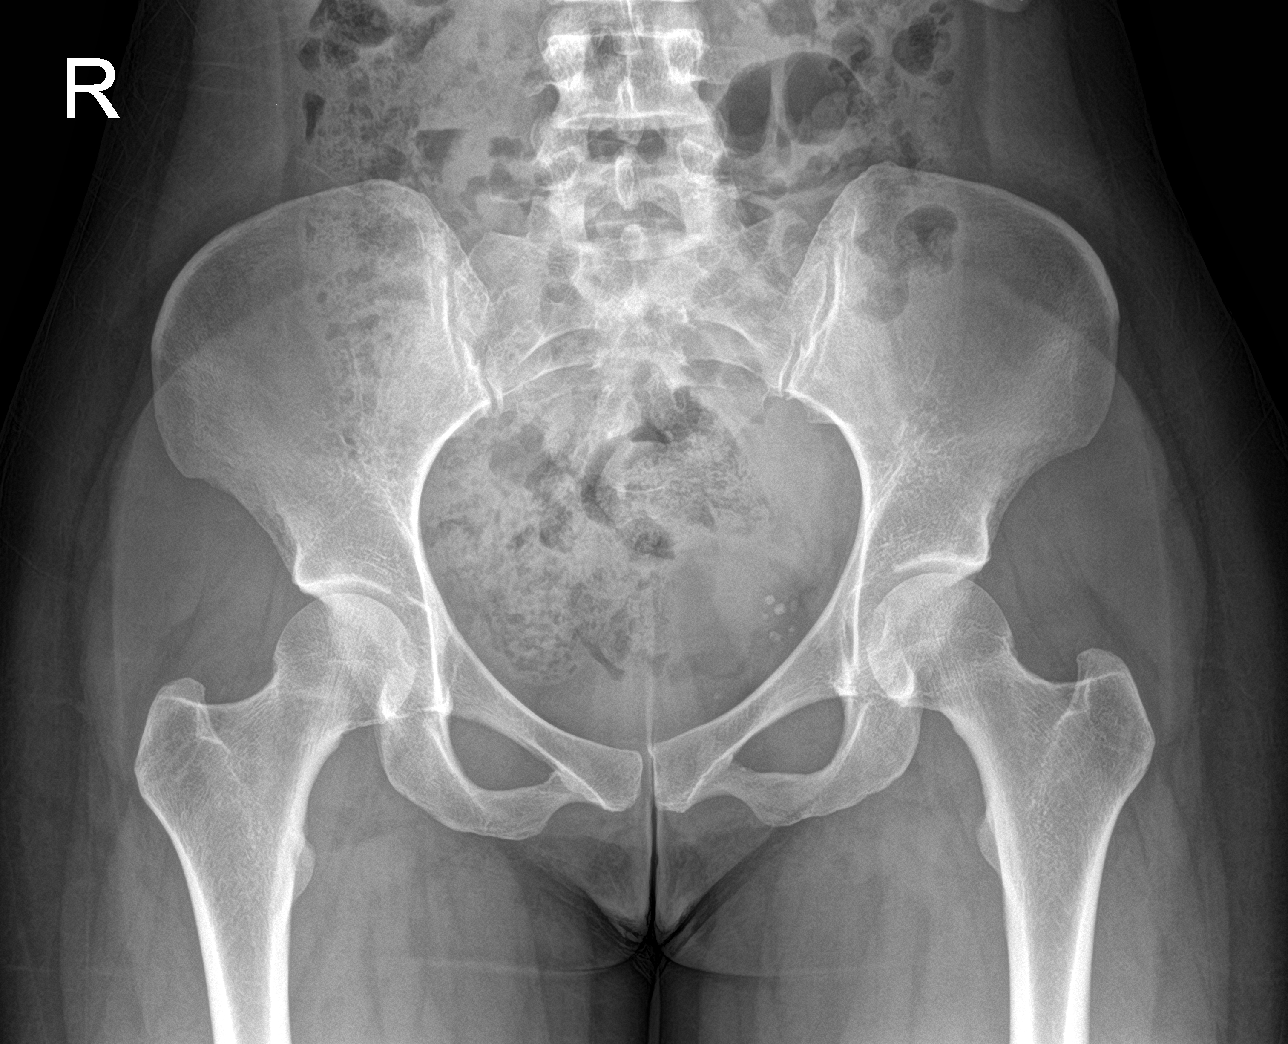

[hip ap]
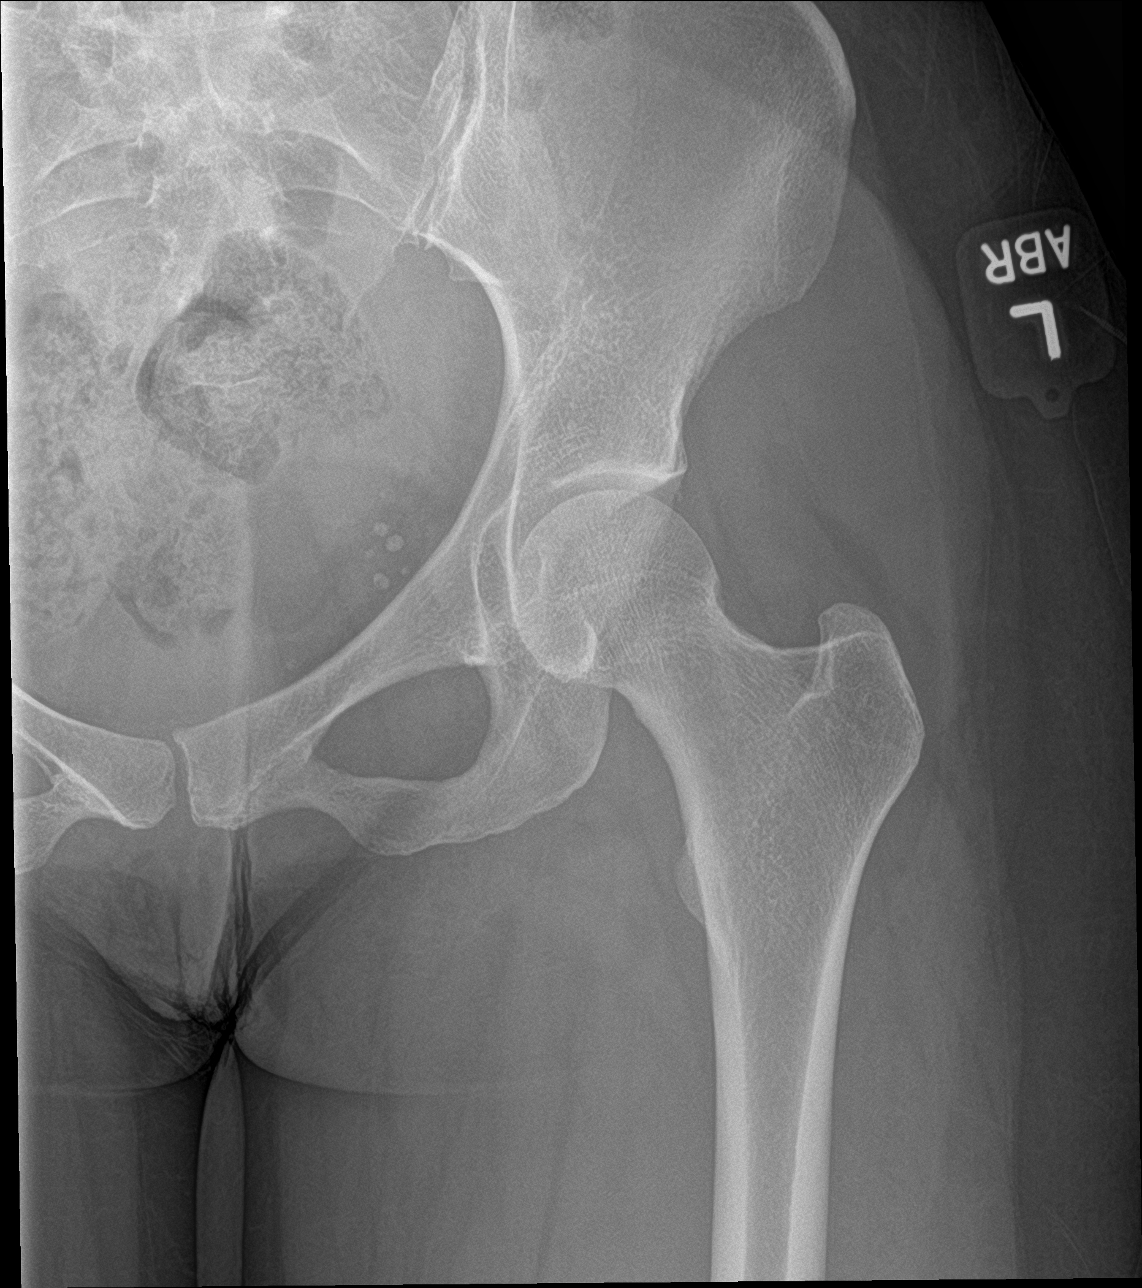

[hip lat]
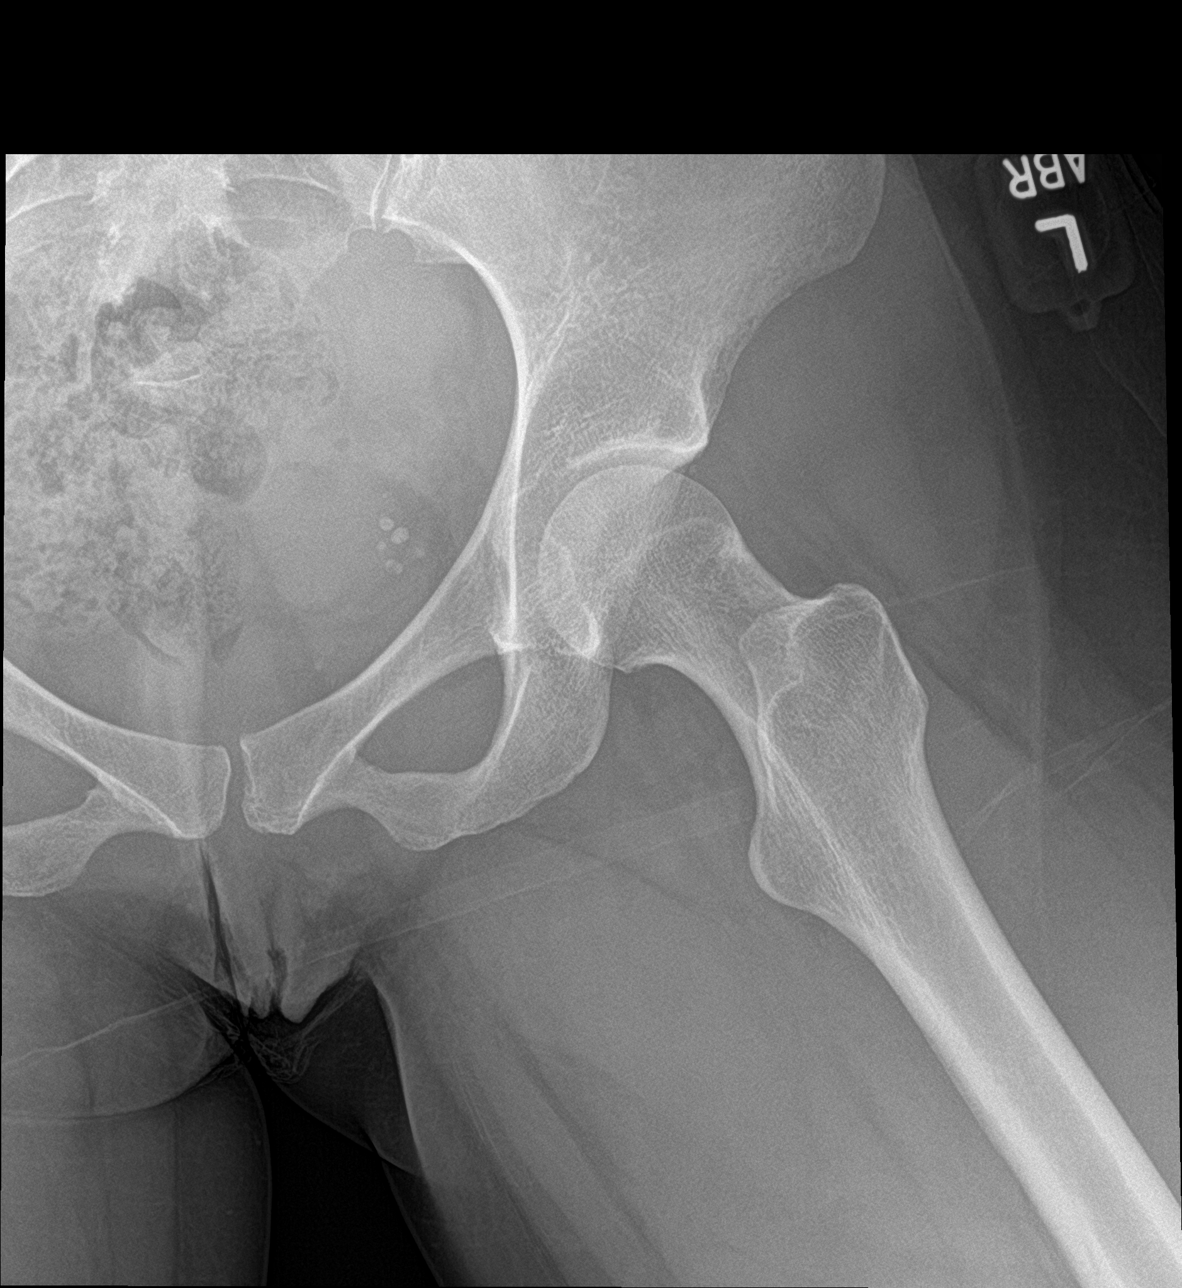

[3 of 3 positions shown; findings below may reference images not displayed]

FINDINGS: There is no evidence of hip fracture or dislocation. There is no
evidence of arthropathy or other focal bone abnormality.
IMPRESSION: Normal exam.

## 2021-12-19 ENCOUNTER — Other Ambulatory Visit: Payer: Self-pay | Admitting: Adult Health

## 2021-12-21 ENCOUNTER — Ambulatory Visit: Payer: Managed Care, Other (non HMO)

## 2021-12-25 ENCOUNTER — Ambulatory Visit: Payer: Managed Care, Other (non HMO)

## 2022-01-01 ENCOUNTER — Ambulatory Visit (INDEPENDENT_AMBULATORY_CARE_PROVIDER_SITE_OTHER): Payer: Managed Care, Other (non HMO) | Admitting: *Deleted

## 2022-01-01 DIAGNOSIS — Z3042 Encounter for surveillance of injectable contraceptive: Secondary | ICD-10-CM

## 2022-01-01 MED ORDER — MEDROXYPROGESTERONE ACETATE 150 MG/ML IM SUSP
150.0000 mg | Freq: Once | INTRAMUSCULAR | Status: AC
  Administered 2022-01-01: 150 mg via INTRAMUSCULAR

## 2022-01-01 NOTE — Progress Notes (Signed)
   NURSE VISIT- INJECTION  SUBJECTIVE:  Heather Dodson is a 41 y.o. G42P1021 female here for a Depo Provera for contraception/period management. She is a GYN patient.   OBJECTIVE:  There were no vitals taken for this visit.  Appears well, in no apparent distress  Injection administered in: Left deltoid  Meds ordered this encounter  Medications   medroxyPROGESTERone (DEPO-PROVERA) injection 150 mg    ASSESSMENT: GYN patient Depo Provera for contraception/period management PLAN: Follow-up: in 11-13 weeks for next Depo   Heather Dodson  01/01/2022 4:05 PM

## 2022-03-19 ENCOUNTER — Ambulatory Visit (INDEPENDENT_AMBULATORY_CARE_PROVIDER_SITE_OTHER): Payer: Managed Care, Other (non HMO) | Admitting: *Deleted

## 2022-03-19 ENCOUNTER — Ambulatory Visit: Payer: Managed Care, Other (non HMO)

## 2022-03-19 DIAGNOSIS — Z3042 Encounter for surveillance of injectable contraceptive: Secondary | ICD-10-CM

## 2022-03-19 MED ORDER — MEDROXYPROGESTERONE ACETATE 150 MG/ML IM SUSP
150.0000 mg | Freq: Once | INTRAMUSCULAR | Status: AC
  Administered 2022-03-19: 150 mg via INTRAMUSCULAR

## 2022-03-19 NOTE — Progress Notes (Signed)
   NURSE VISIT- INJECTION  SUBJECTIVE:  Heather Dodson is a 41 y.o. G78P1021 female here for a Depo Provera for contraception/period management. She is a GYN patient.   OBJECTIVE:  There were no vitals taken for this visit.  Appears well, in no apparent distress  Injection administered in: Right deltoid  Meds ordered this encounter  Medications   medroxyPROGESTERone (DEPO-PROVERA) injection 150 mg    ASSESSMENT: GYN patient Depo Provera for contraception/period management PLAN: Follow-up: in 11-13 weeks for next Depo   Parminder Trapani  03/19/2022 11:14 AM

## 2022-03-26 ENCOUNTER — Ambulatory Visit: Payer: Managed Care, Other (non HMO)

## 2022-04-13 ENCOUNTER — Other Ambulatory Visit: Payer: Managed Care, Other (non HMO) | Admitting: Adult Health

## 2022-04-30 ENCOUNTER — Other Ambulatory Visit (HOSPITAL_COMMUNITY)
Admission: RE | Admit: 2022-04-30 | Discharge: 2022-04-30 | Disposition: A | Discharge status: Home / Self Care | Payer: Managed Care, Other (non HMO) | Source: Ambulatory Visit | Attending: Adult Health | Admitting: Adult Health

## 2022-04-30 ENCOUNTER — Encounter: Payer: Self-pay | Admitting: Adult Health

## 2022-04-30 ENCOUNTER — Ambulatory Visit (INDEPENDENT_AMBULATORY_CARE_PROVIDER_SITE_OTHER): Payer: Managed Care, Other (non HMO) | Admitting: Adult Health

## 2022-04-30 VITALS — BP 126/82 | HR 87 | Ht 59.0 in | Wt 137.6 lb

## 2022-04-30 DIAGNOSIS — Z3042 Encounter for surveillance of injectable contraceptive: Secondary | ICD-10-CM

## 2022-04-30 DIAGNOSIS — Z01419 Encounter for gynecological examination (general) (routine) without abnormal findings: Secondary | ICD-10-CM | POA: Insufficient documentation

## 2022-04-30 DIAGNOSIS — Z1211 Encounter for screening for malignant neoplasm of colon: Secondary | ICD-10-CM

## 2022-04-30 DIAGNOSIS — I10 Essential (primary) hypertension: Secondary | ICD-10-CM

## 2022-04-30 DIAGNOSIS — Z1231 Encounter for screening mammogram for malignant neoplasm of breast: Secondary | ICD-10-CM

## 2022-04-30 LAB — HEMOCCULT GUIAC POC 1CARD (OFFICE): Fecal Occult Blood, POC: NEGATIVE

## 2022-04-30 MED ORDER — AMLODIPINE BESYLATE 5 MG PO TABS: 5.0000 mg | ORAL_TABLET | Freq: Every day | ORAL | 3 refills | Status: DC

## 2022-04-30 MED ORDER — HYDROCHLOROTHIAZIDE 12.5 MG PO CAPS: 12.5000 mg | ORAL_CAPSULE | Freq: Every day | ORAL | 3 refills | Status: DC

## 2022-04-30 MED ORDER — MEDROXYPROGESTERONE ACETATE 150 MG/ML IM SUSP: INTRAMUSCULAR | 4 refills | Status: DC

## 2022-04-30 NOTE — Progress Notes (Signed)
Patient ID: Heather Dodson, female   DOB: 01-Aug-1980, 42 y.o.   MRN: [id number] History of Present Illness: Heather Dodson is a 42 year old black female,single, G3P1021, in for a well woman gyn exam and pap. She is happy with depo. She had labs at work.  No PCP currently   Current Medications, Allergies, Past Medical History, Past Surgical History, Family History and Social History were reviewed in [employer] record.     Review of Systems: Patient denies any headaches, hearing loss, fatigue, blurred vision, shortness of breath, chest pain, abdominal pain, problems with bowel movements, urination, or intercourse. No joint pain or mood swings.     Physical Exam:BP 126/82 (BP Location: Right Arm, Patient Position: Sitting, Cuff Size: Normal)   Pulse 87   Ht 4\' 11"  (1.499 m)   Wt 137 lb 9.6 oz (62.4 kg)   BMI 27.79 kg/m   General:  Well developed, well nourished, no acute distress Skin:  Warm and dry Neck:  Midline trachea, normal thyroid, good ROM, no lymphadenopathy Lungs; Clear to auscultation bilaterally Breast:  No dominant palpable mass, retraction, or nipple discharge Cardiovascular: Regular rate and rhythm Abdomen:  Soft, non tender, no hepatosplenomegaly Pelvic:  External genitalia is normal in appearance, no lesions.  The vagina is normal in appearance. Urethra has no lesions or masses. The cervix is smooth, pap with GC/CHL and HR HPV genotyping performed.  Uterus is felt to be normal size, shape, and contour.  No adnexal masses or tenderness noted.Bladder is non tender, no masses felt. Rectal: Good sphincter tone, no polyps, or hemorrhoids felt.  Hemoccult negative. Extremities/musculoskeletal:  No swelling or varicosities noted, no clubbing or cyanosis Psych:  No mood changes, alert and cooperative,seems happy AA is 1 Fall risk is low    04/30/2022    9:58 AM 04/12/2021    3:35 PM 05/07/2019    3:16 PM  Depression screen PHQ 2/9  Decreased Interest 0 0 0   Down, Depressed, Hopeless 0 0 0  PHQ - 2 Score 0 0 0  Altered sleeping 0 0   Tired, decreased energy 0 0   Change in appetite 0 0   Feeling bad or failure about yourself  0 0   Trouble concentrating 0 0   Moving slowly or fidgety/restless 0 0   Suicidal thoughts 0 0   PHQ-9 Score 0 0   Difficult doing work/chores Not difficult at all         04/30/2022   10:00 AM 04/12/2021    3:35 PM  GAD 7 : Generalized Anxiety Score  Nervous, Anxious, on Edge 0 0  Control/stop worrying 0 0  Worry too much - different things 0 0  Trouble relaxing 0 0  Restless 0 0  Easily annoyed or irritable 0 0  Afraid - awful might happen 0 0  Total GAD 7 Score 0 0    Upstream - 04/30/22 1000       Pregnancy Intention Screening   Does the patient want to become pregnant in the next year? No    Does the patient's partner want to become pregnant in the next year? No    Would the patient like to discuss contraceptive options today? No      Contraception Wrap Up   Current Method Hormonal Injection    End Method Hormonal Injection    Contraception Counseling Provided No              Examination chaperoned by Celene Squibb  LPN   Impression and Plan: 1. Encounter for gynecological examination with Papanicolaou smear of cervix Pap sent Pap in 3 years if norma; Physical in 1 year Labs at work, to get me a copy  - Cytology - PAP( [facility])  2. Encounter for surveillance of injectable contraceptive Will refill depo provera Next injection 06/11/22   3. Essential hypertension Will refill Norvasc 5 mg and Microzide 12.5 mg   Meds ordered this encounter  Medications   amLODipine (NORVASC) 5 MG tablet    Sig: Take 1 tablet (5 mg total) by mouth daily.    Dispense:  90 tablet    Refill:  3    Order Specific Question:   Supervising Provider    Answer:   Tania Ade H [[id number]]   hydrochlorothiazide (MICROZIDE) 12.5 MG capsule    Sig: Take 1 capsule (12.5 mg total) by mouth daily.     Dispense:  90 capsule    Refill:  3    Order Specific Question:   Supervising Provider    Answer:   Tania Ade H [[id number]]   medroxyPROGESTERone (DEPO-PROVERA) 150 MG/ML injection    Sig: Inject 1 ml IM every 12 weeks in office    Dispense:  1 mL    Refill:  4    Order Specific Question:   Supervising Provider    Answer:   Elonda Husky, LUTHER H [[id number]]     4. Encounter for screening fecal occult blood testing Hemoccult was negative  - POCT occult blood stool  5. Screening mammogram for breast cancer Mammogram scheduled for her 05/14/22 at 6 pm at [facility]; Future

## 2022-05-01 LAB — CYTOLOGY - PAP
Chlamydia: NEGATIVE
Comment: NEGATIVE
Comment: NEGATIVE
Comment: NORMAL
Diagnosis: NEGATIVE
High risk HPV: NEGATIVE
Neisseria Gonorrhea: NEGATIVE

## 2022-05-02 ENCOUNTER — Other Ambulatory Visit: Payer: Self-pay | Admitting: Adult Health

## 2022-05-02 MED ORDER — METRONIDAZOLE 500 MG PO TABS: 500.0000 mg | ORAL_TABLET | Freq: Two times a day (BID) | ORAL | 0 refills | Status: DC

## 2022-05-02 NOTE — Progress Notes (Signed)
+  BV on pap rx flagyl  

## 2022-05-14 ENCOUNTER — Ambulatory Visit (HOSPITAL_COMMUNITY): Payer: Managed Care, Other (non HMO)

## 2022-06-11 ENCOUNTER — Ambulatory Visit: Payer: Managed Care, Other (non HMO)

## 2022-06-14 ENCOUNTER — Ambulatory Visit: Payer: Managed Care, Other (non HMO)

## 2022-06-14 DIAGNOSIS — Z3042 Encounter for surveillance of injectable contraceptive: Secondary | ICD-10-CM | POA: Diagnosis not present

## 2022-06-14 MED ORDER — MEDROXYPROGESTERONE ACETATE 150 MG/ML IM SUSY
150.0000 mg | PREFILLED_SYRINGE | Freq: Once | INTRAMUSCULAR | Status: AC
  Administered 2022-06-14: 150 mg via INTRAMUSCULAR

## 2022-06-14 NOTE — Progress Notes (Signed)
   NURSE VISIT- INJECTION  SUBJECTIVE:  Heather Dodson is a 42 y.o. G71P1021 female here for a Depo Provera for contraception/period management. She is a GYN patient.   OBJECTIVE:  There were no vitals taken for this visit.  Appears well, in no apparent distress  Injection administered in: Left deltoid  Meds ordered this encounter  Medications   medroxyPROGESTERone Acetate SUSY 150 mg    ASSESSMENT: GYN patient Depo Provera for contraception/period management PLAN: Follow-up: in 11-13 weeks for next Depo   Levy Pupa  06/14/2022 4:37 PM

## 2022-07-04 ENCOUNTER — Ambulatory Visit (HOSPITAL_COMMUNITY): Payer: Managed Care, Other (non HMO)

## 2022-09-06 ENCOUNTER — Ambulatory Visit: Payer: Managed Care, Other (non HMO)

## 2022-09-13 ENCOUNTER — Ambulatory Visit: Payer: Managed Care, Other (non HMO)

## 2022-09-14 ENCOUNTER — Ambulatory Visit: Payer: Managed Care, Other (non HMO) | Admitting: *Deleted

## 2022-09-14 DIAGNOSIS — Z3042 Encounter for surveillance of injectable contraceptive: Secondary | ICD-10-CM | POA: Diagnosis not present

## 2022-09-14 MED ORDER — MEDROXYPROGESTERONE ACETATE 150 MG/ML IM SUSP
150.0000 mg | Freq: Once | INTRAMUSCULAR | Status: AC
  Administered 2022-09-14: 150 mg via INTRAMUSCULAR

## 2022-09-14 NOTE — Progress Notes (Signed)
   NURSE VISIT- INJECTION  SUBJECTIVE:  Heather Dodson is a 42 y.o. G34P1021 female here for a Depo Provera for contraception/period management. She is a GYN patient.   OBJECTIVE:  There were no vitals taken for this visit.  Appears well, in no apparent distress  Injection administered in: Left deltoid  Meds ordered this encounter  Medications   medroxyPROGESTERone (DEPO-PROVERA) injection 150 mg    ASSESSMENT: GYN patient Depo Provera for contraception/period management PLAN: Follow-up: in 11-13 weeks for next Depo   Tiffiany Fuoco  09/14/2022 11:04 AM

## 2022-12-10 ENCOUNTER — Ambulatory Visit: Payer: Managed Care, Other (non HMO) | Admitting: *Deleted

## 2022-12-10 DIAGNOSIS — Z3042 Encounter for surveillance of injectable contraceptive: Secondary | ICD-10-CM | POA: Diagnosis not present

## 2022-12-10 MED ORDER — MEDROXYPROGESTERONE ACETATE 150 MG/ML IM SUSP
150.0000 mg | Freq: Once | INTRAMUSCULAR | Status: AC
  Administered 2022-12-10: 150 mg via INTRAMUSCULAR

## 2022-12-10 NOTE — Progress Notes (Signed)
   NURSE VISIT- INJECTION  SUBJECTIVE:  Heather Dodson is a 42 y.o. G20P1021 female here for a Depo Provera for contraception/period management. She is a GYN patient.   OBJECTIVE:  There were no vitals taken for this visit.  Appears well, in no apparent distress  Injection administered in: Right deltoid  Meds ordered this encounter  Medications   medroxyPROGESTERone (DEPO-PROVERA) injection 150 mg    ASSESSMENT: GYN patient Depo Provera for contraception/period management PLAN: Follow-up: in 11-13 weeks for next Depo   Shawndell Salib  12/10/2022 9:37 AM

## 2023-03-04 ENCOUNTER — Ambulatory Visit: Payer: Managed Care, Other (non HMO)

## 2023-03-13 ENCOUNTER — Ambulatory Visit: Payer: Managed Care, Other (non HMO)

## 2023-03-13 DIAGNOSIS — Z3042 Encounter for surveillance of injectable contraceptive: Secondary | ICD-10-CM

## 2023-03-13 MED ORDER — MEDROXYPROGESTERONE ACETATE 150 MG/ML IM SUSY
150.0000 mg | PREFILLED_SYRINGE | Freq: Once | INTRAMUSCULAR | Status: AC
  Administered 2023-03-13: 150 mg via INTRAMUSCULAR

## 2023-03-13 NOTE — Progress Notes (Signed)
   NURSE VISIT- INJECTION  SUBJECTIVE:  Heather Dodson is a 42 y.o. G79P1021 female here for a Depo Provera for contraception/period management. She is a GYN patient.   OBJECTIVE:  There were no vitals taken for this visit.  Appears well, in no apparent distress  Injection administered in: Left deltoid  Meds ordered this encounter  Medications   medroxyPROGESTERone Acetate SUSY 150 mg    ASSESSMENT: GYN patient Depo Provera for contraception/period management PLAN: Follow-up: in 11-13 weeks for next Depo   Malachy Mood  03/13/2023 8:49 AM

## 2023-06-01 ENCOUNTER — Other Ambulatory Visit: Payer: Self-pay | Admitting: Adult Health

## 2023-06-05 ENCOUNTER — Ambulatory Visit: Payer: Managed Care, Other (non HMO)

## 2023-06-06 ENCOUNTER — Other Ambulatory Visit: Payer: Self-pay | Admitting: Adult Health

## 2023-06-11 ENCOUNTER — Ambulatory Visit: Payer: Managed Care, Other (non HMO) | Admitting: *Deleted

## 2023-06-11 DIAGNOSIS — Z3042 Encounter for surveillance of injectable contraceptive: Secondary | ICD-10-CM

## 2023-06-11 MED ORDER — MEDROXYPROGESTERONE ACETATE 150 MG/ML IM SUSP
150.0000 mg | Freq: Once | INTRAMUSCULAR | Status: AC
  Administered 2023-06-11: 150 mg via INTRAMUSCULAR

## 2023-06-11 NOTE — Progress Notes (Signed)
   NURSE VISIT- INJECTION  SUBJECTIVE:  Heather Dodson is a 43 y.o. G8P1021 female here for a Depo Provera for contraception/period management. She is a GYN patient.   OBJECTIVE:  There were no vitals taken for this visit.  Appears well, in no apparent distress  Injection administered in: Right deltoid  Meds ordered this encounter  Medications   medroxyPROGESTERone (DEPO-PROVERA) injection 150 mg    ASSESSMENT: GYN patient Depo Provera for contraception/period management PLAN: Follow-up: in 11-13 weeks for next Depo   Hatsuko Bizzarro  06/11/2023 4:04 PM

## 2023-07-17 ENCOUNTER — Ambulatory Visit: Payer: Managed Care, Other (non HMO) | Admitting: Adult Health

## 2023-08-30 ENCOUNTER — Ambulatory Visit: Admitting: Adult Health

## 2023-09-03 ENCOUNTER — Ambulatory Visit: Payer: Managed Care, Other (non HMO)

## 2023-09-10 ENCOUNTER — Ambulatory Visit

## 2023-09-10 DIAGNOSIS — Z3042 Encounter for surveillance of injectable contraceptive: Secondary | ICD-10-CM | POA: Diagnosis not present

## 2023-09-10 MED ORDER — MEDROXYPROGESTERONE ACETATE 150 MG/ML IM SUSP
150.0000 mg | Freq: Once | INTRAMUSCULAR | Status: AC
  Administered 2023-09-10: 150 mg via INTRAMUSCULAR

## 2023-09-10 NOTE — Progress Notes (Signed)
   NURSE VISIT- INJECTION  SUBJECTIVE:  Heather Dodson is a 43 y.o. G53P1021 female here for a Depo Provera  for contraception/period management. She is a GYN patient.   OBJECTIVE:  There were no vitals taken for this visit.  Appears well, in no apparent distress  Injection administered in: Left deltoid  Meds ordered this encounter  Medications   medroxyPROGESTERone  (DEPO-PROVERA ) injection 150 mg    ASSESSMENT: GYN patient Depo Provera  for contraception/period management PLAN: Follow-up: in 11-13 weeks for next Depo   Temitayo Covalt  09/10/2023 8:46 AM

## 2023-09-23 ENCOUNTER — Other Ambulatory Visit: Payer: Self-pay | Admitting: Adult Health

## 2023-10-25 ENCOUNTER — Ambulatory Visit: Admitting: Adult Health

## 2023-10-25 ENCOUNTER — Encounter: Payer: Self-pay | Admitting: Adult Health

## 2023-10-25 VITALS — BP 141/95 | HR 96 | Ht 59.0 in | Wt 150.0 lb

## 2023-10-25 DIAGNOSIS — Z1331 Encounter for screening for depression: Secondary | ICD-10-CM | POA: Diagnosis not present

## 2023-10-25 DIAGNOSIS — Z1211 Encounter for screening for malignant neoplasm of colon: Secondary | ICD-10-CM

## 2023-10-25 DIAGNOSIS — I10 Essential (primary) hypertension: Secondary | ICD-10-CM | POA: Diagnosis not present

## 2023-10-25 DIAGNOSIS — Z01419 Encounter for gynecological examination (general) (routine) without abnormal findings: Secondary | ICD-10-CM | POA: Diagnosis not present

## 2023-10-25 DIAGNOSIS — Z30011 Encounter for initial prescription of contraceptive pills: Secondary | ICD-10-CM

## 2023-10-25 DIAGNOSIS — Z1231 Encounter for screening mammogram for malignant neoplasm of breast: Secondary | ICD-10-CM | POA: Diagnosis not present

## 2023-10-25 LAB — HEMOCCULT GUIAC POC 1CARD (OFFICE): Fecal Occult Blood, POC: NEGATIVE

## 2023-10-25 MED ORDER — NORETHINDRONE 0.35 MG PO TABS: 1.0000 | ORAL_TABLET | Freq: Every day | ORAL | 3 refills | Status: AC

## 2023-10-25 NOTE — Progress Notes (Signed)
 Patient ID: Heather Dodson, female   DOB: 1980-06-14, 43 y.o.   MRN: [id number] History of Present Illness: Heather Dodson is a 43 year old black female,single, G3P1021 in for a well woman gyn exam. She is on depo, and has been for a while, but is thinking about stopping it due to brain tumor risk, not having sex currently.      Component Value Date/Time   DIAGPAP  04/30/2022 0955    - Negative for intraepithelial lesion or malignancy (NILM)   DIAGPAP  05/07/2019 1518    - Negative for intraepithelial lesion or malignancy (NILM)   HPVHIGH Negative 04/30/2022 0955   HPVHIGH Negative 05/07/2019 1518   ADEQPAP  04/30/2022 0955    Satisfactory for evaluation; transformation zone component PRESENT.   ADEQPAP  05/07/2019 1518    Satisfactory for evaluation; transformation zone component ABSENT.     Current Medications, Allergies, Past Medical History, Past Surgical History, Family History and Social History were reviewed in [employer] record.     Review of Systems:  Patient denies any headaches, hearing loss, fatigue, blurred vision, shortness of breath, chest pain, abdominal pain, problems with bowel movements, urination, or intercourse(not currently active). No joint pain or mood swings.    Physical Exam:BP (!) 141/95 (BP Location: Right Arm, Patient Position: Sitting, Cuff Size: Normal)   Pulse 96   Ht 4' 11 (1.499 m)   Wt 150 lb (68 kg)   BMI 30.30 kg/m   General:  Well developed, well nourished, no acute distress Skin:  Warm and dry Neck:  Midline trachea, normal thyroid, good ROM, no lymphadenopathy Lungs; Clear to auscultation bilaterally Breast:  No dominant palpable mass, retraction, or nipple discharge Cardiovascular: Regular rate and rhythm Abdomen:  Soft, non tender, no hepatosplenomegaly Pelvic:  External genitalia is normal in appearance, no lesions.  The vagina is normal in appearance. Urethra has no lesions or masses. The cervix is bulbous.  Uterus is  felt to be normal size, shape, and contour.  No adnexal masses or tenderness noted.Bladder is non tender, no masses felt. Rectal: Good sphincter tone, no polyps, or hemorrhoids felt.  Hemoccult negative. Extremities/musculoskeletal:  No swelling or varicosities noted, no clubbing or cyanosis Psych:  No mood changes, alert and cooperative,seems happy AA is 2 Fall risk is low    10/25/2023    9:43 AM 04/30/2022    9:58 AM 04/12/2021    3:35 PM  Depression screen PHQ 2/9  Decreased Interest 0 0 0  Down, Depressed, Hopeless 0 0 0  PHQ - 2 Score 0 0 0  Altered sleeping 0 0 0  Tired, decreased energy 0 0 0  Change in appetite 0 0 0  Feeling bad or failure about yourself  0 0 0  Trouble concentrating 0 0 0  Moving slowly or fidgety/restless 0 0 0  Suicidal thoughts 0 0 0  PHQ-9 Score 0 0 0  Difficult doing work/chores  Not difficult at all        10/25/2023    9:43 AM 04/30/2022   10:00 AM 04/12/2021    3:35 PM  GAD 7 : Generalized Anxiety Score  Nervous, Anxious, on Edge 0 0 0  Control/stop worrying 0 0 0  Worry too much - different things 0 0 0  Trouble relaxing 0 0 0  Restless 0 0 0  Easily annoyed or irritable 0 0 0  Afraid - awful might happen 0 0 0  Total GAD 7 Score 0 0 0  Upstream - 10/25/23 0942       Pregnancy Intention Screening   Does the patient want to become pregnant in the next year? No    Does the patient's partner want to become pregnant in the next year? No    Would the patient like to discuss contraceptive options today? No      Contraception Wrap Up   Current Method Hormonal Injection    End Method Hormonal Injection    Contraception Counseling Provided Yes           Examination chaperoned by Clarita Salt LPN  Impression and plan: 1. Encounter for well woman exam with routine gynecological exam (Primary) Pap in 2027 Physical in 1 year Had labs at work, get me a copy   2. Encounter for screening fecal occult blood testing Hemoccult was  negative  - POCT occult blood stool  3. Screening mammogram for breast cancer Call for appt - MM 3D SCREENING MAMMOGRAM BILATERAL BREAST; Future  4. Essential hypertension Continue norvasc  5 mg and microzide  12.5 mg 1 daily , has refills  5. Encounter for initial prescription of contraceptive pills Next depo was due  August 19, will stop that and start Micronor , can start 12/01/23  Meds ordered this encounter  Medications   norethindrone  (MICRONOR ) 0.35 MG tablet    Sig: Take 1 tablet (0.35 mg total) by mouth daily.    Dispense:  84 tablet    Refill:  3    Supervising Provider:   JAYNE MINDER H [[id number]]   Follow up in 3 months for ROS and BP check

## 2023-12-03 ENCOUNTER — Ambulatory Visit

## 2024-01-27 ENCOUNTER — Ambulatory Visit: Admitting: Adult Health
